# Patient Record
Sex: Male | Born: 1935 | Race: White | Hispanic: No | Marital: Married | Smoking: Never smoker
Health system: Southern US, Community
[De-identification: ages and names within clinical notes are randomized; demographics above are authoritative.]

## PROBLEM LIST (undated history)

## (undated) DIAGNOSIS — N5089 Other specified disorders of the male genital organs: Secondary | ICD-10-CM

## (undated) DIAGNOSIS — I10 Essential (primary) hypertension: Secondary | ICD-10-CM

## (undated) DIAGNOSIS — R972 Elevated prostate specific antigen [PSA]: Secondary | ICD-10-CM

## (undated) DIAGNOSIS — N471 Phimosis: Secondary | ICD-10-CM

## (undated) DIAGNOSIS — I639 Cerebral infarction, unspecified: Secondary | ICD-10-CM

## (undated) DIAGNOSIS — N3281 Overactive bladder: Secondary | ICD-10-CM

## (undated) DIAGNOSIS — E785 Hyperlipidemia, unspecified: Secondary | ICD-10-CM

## (undated) HISTORY — DX: Phimosis: N47.1

## (undated) HISTORY — DX: Hyperlipidemia, unspecified: E78.5

## (undated) HISTORY — DX: Overactive bladder: N32.81

## (undated) HISTORY — DX: Cerebral infarction, unspecified: I63.9

## (undated) HISTORY — DX: Essential (primary) hypertension: I10

## (undated) HISTORY — DX: Elevated prostate specific antigen (PSA): R97.20

## (undated) HISTORY — DX: Other specified disorders of the male genital organs: N50.89

---

## 1974-09-17 HISTORY — PX: COLONOSCOPY W/ POLYPECTOMY: SHX1380

## 2005-03-29 ENCOUNTER — Emergency Department: Payer: Self-pay | Admitting: Emergency Medicine

## 2005-05-03 ENCOUNTER — Ambulatory Visit: Payer: Self-pay | Admitting: Neurology

## 2005-05-04 ENCOUNTER — Ambulatory Visit: Payer: Self-pay | Admitting: Neurology

## 2005-06-14 ENCOUNTER — Emergency Department: Payer: Self-pay | Admitting: General Practice

## 2005-06-14 ENCOUNTER — Other Ambulatory Visit: Payer: Self-pay

## 2007-01-06 ENCOUNTER — Ambulatory Visit: Payer: Self-pay | Admitting: Neurology

## 2007-07-02 ENCOUNTER — Ambulatory Visit: Payer: Self-pay | Admitting: Unknown Physician Specialty

## 2007-09-02 ENCOUNTER — Ambulatory Visit: Payer: Self-pay | Admitting: Family Medicine

## 2007-10-21 ENCOUNTER — Other Ambulatory Visit: Payer: Self-pay

## 2007-10-21 ENCOUNTER — Ambulatory Visit: Payer: Self-pay | Admitting: Ophthalmology

## 2007-10-29 ENCOUNTER — Ambulatory Visit: Payer: Self-pay | Admitting: Ophthalmology

## 2012-04-11 ENCOUNTER — Ambulatory Visit: Payer: Self-pay

## 2012-05-22 ENCOUNTER — Emergency Department: Payer: Self-pay | Admitting: *Deleted

## 2012-05-23 LAB — COMPREHENSIVE METABOLIC PANEL
Albumin: 3.6 g/dL (ref 3.4–5.0)
Alkaline Phosphatase: 72 U/L (ref 50–136)
Anion Gap: 8 (ref 7–16)
Bilirubin,Total: 0.3 mg/dL (ref 0.2–1.0)
Calcium, Total: 8.3 mg/dL — ABNORMAL LOW (ref 8.5–10.1)
Chloride: 108 mmol/L — ABNORMAL HIGH (ref 98–107)
Creatinine: 1.2 mg/dL (ref 0.60–1.30)
EGFR (Non-African Amer.): 57 — ABNORMAL LOW
Glucose: 155 mg/dL — ABNORMAL HIGH (ref 65–99)
Osmolality: 287 (ref 275–301)
Potassium: 3.5 mmol/L (ref 3.5–5.1)
SGOT(AST): 21 U/L (ref 15–37)
Sodium: 142 mmol/L (ref 136–145)

## 2012-05-23 LAB — CBC
HCT: 37.8 % — ABNORMAL LOW (ref 40.0–52.0)
HGB: 12.7 g/dL — ABNORMAL LOW (ref 13.0–18.0)
MCH: 33.4 pg (ref 26.0–34.0)
MCHC: 33.5 g/dL (ref 32.0–36.0)
RDW: 15.5 % — ABNORMAL HIGH (ref 11.5–14.5)
WBC: 6.6 10*3/uL (ref 3.8–10.6)

## 2012-05-23 LAB — PROTIME-INR: Prothrombin Time: 12.8 secs (ref 11.5–14.7)

## 2012-05-23 LAB — APTT: Activated PTT: 30.5 secs (ref 23.6–35.9)

## 2012-07-07 DIAGNOSIS — R972 Elevated prostate specific antigen [PSA]: Secondary | ICD-10-CM | POA: Insufficient documentation

## 2012-07-07 DIAGNOSIS — R351 Nocturia: Secondary | ICD-10-CM | POA: Insufficient documentation

## 2012-07-07 DIAGNOSIS — R350 Frequency of micturition: Secondary | ICD-10-CM | POA: Insufficient documentation

## 2012-07-07 DIAGNOSIS — N3941 Urge incontinence: Secondary | ICD-10-CM | POA: Insufficient documentation

## 2012-07-07 DIAGNOSIS — N401 Enlarged prostate with lower urinary tract symptoms: Secondary | ICD-10-CM | POA: Insufficient documentation

## 2013-12-23 DIAGNOSIS — I10 Essential (primary) hypertension: Secondary | ICD-10-CM

## 2013-12-23 DIAGNOSIS — Z8669 Personal history of other diseases of the nervous system and sense organs: Secondary | ICD-10-CM | POA: Insufficient documentation

## 2013-12-23 HISTORY — DX: Essential (primary) hypertension: I10

## 2014-04-24 ENCOUNTER — Ambulatory Visit: Payer: Self-pay | Admitting: Neurology

## 2014-07-14 ENCOUNTER — Ambulatory Visit: Payer: Self-pay | Admitting: Urology

## 2014-11-03 ENCOUNTER — Ambulatory Visit: Payer: Self-pay | Admitting: Urology

## 2014-11-03 DIAGNOSIS — I10 Essential (primary) hypertension: Secondary | ICD-10-CM

## 2014-11-15 ENCOUNTER — Ambulatory Visit: Payer: Self-pay | Admitting: Urology

## 2014-11-23 ENCOUNTER — Emergency Department: Payer: Self-pay | Admitting: Emergency Medicine

## 2015-01-10 LAB — SURGICAL PATHOLOGY

## 2015-01-16 NOTE — Op Note (Signed)
PATIENT NAME:  Jacob Clark, Jacob D MR#:  [id number] DATE OF BIRTH:  06-10-1930  DATE OF PROCEDURE:  11/15/2014  PREOPERATIVE DIAGNOSIS: Severe phimosis with adherence to the glans penis by the foreskin.   POSTOPERATIVE DIAGNOSIS: Severe phimosis with adherence to the glans penis by the foreskin.  PROCEDURE: Adult circumcision.   ANESTHESIA: General and local ring block at the base of the penis.   SURGEON: Lorraine Laxichard D. Kaylynne Andres.   DESCRIPTION OF PROCEDURE: With the patient sterilely prepped and draped in supine position, on the operating room table, the procedure commences after an appropriate timeout. The glans so adhered to the foreskin that a lot of sharp dissection has to be done. Eventually I am able to find the corona and at least some mucosa in place for the reapproximation of the penile skin to the area of the mucosa of the corona radiata. Once I free the whole glans up carefully avoiding any excursion into the urethra, which is almost adherent to the skin, I am able to circumferentially remove the excess foreskin and incise the lateral fascial bands. Then I am able to reapproximate the penile skin to the mucosa, at the corona radiata, with interrupted 4-0 chromic suture. Bleeding was controlled with electrocautery. Then a circumferential 1 inch wrap is placed over a Xeroform dressing around the reapproximation site. The penis is then wrapped with this 1 inch Kling. The patient is sent to recovery in satisfactory condition with a fairly good result considering the severe prepubertal adhesions that were present.   ____________________________ Caralyn Guileichard D. Edwyna ShellHart, DO rdh:sb D: 11/15/2014 09:52:16 ET T: 11/15/2014 10:15:52 ET JOB#: [id number]  cc: Caralyn Guileichard D. Edwyna ShellHart, DO, <Dictator> Arcenio Mullaly D Alejandra Hunt DO ELECTRONICALLY SIGNED 11/29/2014 7:36

## 2015-03-15 ENCOUNTER — Ambulatory Visit: Payer: Medicare Other | Admitting: Podiatry

## 2015-03-24 ENCOUNTER — Ambulatory Visit: Payer: Medicare Other | Admitting: Podiatry

## 2015-03-31 ENCOUNTER — Encounter: Payer: Self-pay | Admitting: Podiatry

## 2015-03-31 ENCOUNTER — Ambulatory Visit (INDEPENDENT_AMBULATORY_CARE_PROVIDER_SITE_OTHER): Payer: Medicare Other | Admitting: Podiatry

## 2015-03-31 VITALS — BP 132/73 | HR 60 | Resp 18 | Ht 75.0 in | Wt 230.0 lb

## 2015-03-31 DIAGNOSIS — B351 Tinea unguium: Secondary | ICD-10-CM

## 2015-03-31 DIAGNOSIS — M79676 Pain in unspecified toe(s): Secondary | ICD-10-CM

## 2015-03-31 NOTE — Progress Notes (Signed)
   Subjective:    Patient ID: Jacob Clark, male    DOB: 03/30/1930, 81 y.o.   MRN: [id number]  HPI 81 year old male presents the office they with his wife for painful, elongated toenails for which she is unable to trim himself. Denies any redness or drainage from the nail sites. States his nails are painful particularly with shoe gear as the cause rubbing and irritation. No other complaints at this time.   Review of Systems  HENT:       Hearing loss  Genitourinary: Positive for urgency and frequency.  Musculoskeletal: Positive for gait problem.  Neurological: Positive for tremors and weakness.  Psychiatric/Behavioral: Positive for confusion.  All other systems reviewed and are negative.      Objective:   Physical Exam Awake, alert, NAD  DP/PT pulses are palpable 1/4, CRT less than 3 seconds Protective sensation slightly decreased with Simms Weinstein monofilament, vibratory sensation appears to be decreased, Achilles tendon reflex intact. Nails are hypertrophic, dystrophic, brittle, elongated 10. There is no surrounding erythema or drainage in the nail sites. There is tenderness to palpation overlying nails 1-5 bilaterally. No open lesions or pre-ulcerative lesions identified bilaterally. No other areas of tenderness to bilateral lower extremities.  No pain with calf compression, swelling, warmth, erythema.     Assessment & Plan:   81 year old male with symptomatic onychomycosis -Treatment options discussed including all alternatives, risks, and complications -Nails sharply debrided 10 without complication/bleeding. -Discussed the importance of daily foot inspection. -Follow-up 3 months or sooner if any problems arise. In the meantime, encouraged to call the office with any questions, concerns, change in symptoms.   Ovid CurdMatthew Cannen Dupras, DPM

## 2015-04-03 ENCOUNTER — Encounter: Payer: Self-pay | Admitting: Podiatry

## 2015-04-06 ENCOUNTER — Encounter: Payer: Self-pay | Admitting: Urology

## 2015-04-06 ENCOUNTER — Ambulatory Visit (INDEPENDENT_AMBULATORY_CARE_PROVIDER_SITE_OTHER): Payer: Medicare Other | Admitting: Urology

## 2015-04-06 VITALS — BP 105/65 | HR 68 | Ht 73.0 in | Wt 227.0 lb

## 2015-04-06 DIAGNOSIS — N3281 Overactive bladder: Secondary | ICD-10-CM | POA: Diagnosis not present

## 2015-04-06 LAB — URINALYSIS, COMPLETE
Bilirubin, UA: NEGATIVE
GLUCOSE, UA: NEGATIVE
Leukocytes, UA: NEGATIVE
Nitrite, UA: NEGATIVE
RBC UA: NEGATIVE
Specific Gravity, UA: 1.02 (ref 1.005–1.030)
Urobilinogen, Ur: 0.2 mg/dL (ref 0.2–1.0)
pH, UA: 6 (ref 5.0–7.5)

## 2015-04-06 LAB — MICROSCOPIC EXAMINATION: Bacteria, UA: NONE SEEN

## 2015-04-06 LAB — BLADDER SCAN AMB NON-IMAGING

## 2015-04-06 NOTE — Progress Notes (Signed)
04/06/2015 2:58 PM   Jacob Clark 05-02-1930 [id number]  Referring provider: Jerl MinaJames Hedrick, MD [address] [place], [place] [address]  Chief Complaint  Patient presents with  . Over Active Bladder    HPI: Vesicare did not work for this patient. He's been recently placed on Exelon patch for his advancing Alzheimer's. I will try mybetriq on this patient. If if this does not workI will consider using Botox for intravesical therapy   PMH: Past Medical History  Diagnosis Date  . Hypertension   . Stroke   . Hyperlipemia   . OAB (overactive bladder)   . Scrotal mass   . Elevated PSA   . Phimosis     Surgical History: Past Surgical History  Procedure Laterality Date  . Colonoscopy w/ polypectomy  1976    Home Medications:    Medication List       This list is accurate as of: 04/06/15  2:58 PM.  Always use your most recent med list.               acyclovir 800 MG tablet  Commonly known as:  ZOVIRAX     amLODipine-benazepril 10-20 MG per capsule  Commonly known as:  LOTREL     aspirin EC 81 MG tablet  Take by mouth.     citalopram 40 MG tablet  Commonly known as:  CELEXA     donepezil 10 MG tablet  Commonly known as:  ARICEPT     MULTI-VITAMINS Tabs  Take by mouth.     Olive Leaf Extract 250 MG Caps  Take by mouth.     pentoxifylline 400 MG CR tablet  Commonly known as:  TRENTAL     PREVAGEN PO  Take by mouth.     propranolol ER 120 MG 24 hr capsule  Commonly known as:  INDERAL LA     rivastigmine 4.6 mg/24hr  Commonly known as:  EXELON     SM GINKGO BILOBA 60 MG Tabs  Generic drug:  Ginkgo Biloba  Take by mouth.     vitamin B-1 250 MG tablet  Take by mouth.     VITAMIN D-1000 MAX ST 1000 UNITS tablet  Generic drug:  Cholecalciferol  Take by mouth.        Allergies:  Allergies  Allergen Reactions  . Alprazolam Nausea And Vomiting    Other reaction(s): Unknown Other reaction(s): UNSPECIFIED  . Memantine Hcl Nausea And  Vomiting  . Memantine Rash    Other reaction(s): SWELLING/EDEMA    Family History: Family History  Problem Relation Age of Onset  . Bladder Cancer Neg Hx   . Kidney cancer Neg Hx   . Prostate cancer Neg Hx     Social History:  reports that he has never smoked. He has never used smokeless tobacco. He reports that he does not drink alcohol or use illicit drugs.  ROS: UROLOGY Frequent Urination?: Yes Hard to postpone urination?: Yes Burning/pain with urination?: No Get up at night to urinate?: Yes Leakage of urine?: No Urine stream starts and stops?: No Trouble starting stream?: No Do you have to strain to urinate?: No Blood in urine?: No Urinary tract infection?: No Sexually transmitted disease?: No Injury to kidneys or bladder?: No Painful intercourse?: No Weak stream?: Yes Erection problems?: No Penile pain?: No  Gastrointestinal Nausea?: No Vomiting?: No Indigestion/heartburn?: No Diarrhea?: No Constipation?: No  Constitutional Fever: No Night sweats?: No Weight loss?: No Fatigue?: No  Skin Skin rash/lesions?: No Itching?: No  Eyes Blurred vision?: No Double vision?: No  Ears/Nose/Throat Sore throat?: No Sinus problems?: No  Hematologic/Lymphatic Swollen glands?: No Easy bruising?: No  Cardiovascular Leg swelling?: No Chest pain?: No  Respiratory Cough?: No Shortness of breath?: No  Endocrine Excessive thirst?: No  Musculoskeletal Back pain?: No Joint pain?: No  Neurological Headaches?: No Dizziness?: Yes  Psychologic Depression?: No Anxiety?: No  Physical Exam: BP 105/65 mmHg  Pulse 68  Ht  (1.854 m)  Wt 227 lb (102.967 kg)  BMI 29.96 kg/m2  Constitutional:  Alert and oriented, No acute distress. HEENT: [place] AT, moist mucus membranes.  Trachea midline, no masses. Cardiovascular: No clubbing, cyanosis, or edema. Respiratory: Normal respiratory effort, no increased work of breathing. GI: Abdomen is soft, nontender,  nondistended, no abdominal masses  GU: No CVA tenderness.  Skin: No rashes, bruises or suspicious lesions. Lymph: No cervical or inguinal adenopathy. Neurologic: Grossly intact, no focal deficits, moving all 4 extremities. Psychiatric: Normal mood and affect.  Laboratory Data: Lab Results  Component Value Date   WBC 6.6 05/23/2012   HGB 12.7* 05/23/2012   HCT 37.8* 05/23/2012   MCV 100 05/23/2012   PLT 197 05/23/2012    Lab Results  Component Value Date   CREATININE 1.20 05/23/2012    No results found for: PSA  No results found for: TESTOSTERONE  No results found for: HGBA1C  Urinalysis No results found for: COLORURINE, APPEARANCEUR, LABSPEC, PHURINE, GLUCOSEU, HGBUR, BILIRUBINUR, KETONESUR, PROTEINUR, UROBILINOGEN, NITRITE, LEUKOCYTESUR  Pertinent Imaging: none  Assessment & Plan:  Place the patient on Mreevaluated in 6 weeksM YBETRIQ   1. OAB (overactive bladder)  - Urinalysis, Complete - BLADDER SCAN AMB NON-IMAGING   No Follow-up on file.  Lorraine Lax, MD  [facility] [address] [place], [place] [address] [phone]

## 2015-05-11 ENCOUNTER — Ambulatory Visit: Payer: Medicare Other | Admitting: Urology

## 2015-05-31 ENCOUNTER — Encounter: Payer: Self-pay | Admitting: Urology

## 2015-05-31 ENCOUNTER — Ambulatory Visit (INDEPENDENT_AMBULATORY_CARE_PROVIDER_SITE_OTHER): Payer: Medicare Other | Admitting: Urology

## 2015-05-31 VITALS — BP 102/67 | HR 63 | Ht 74.0 in | Wt 226.8 lb

## 2015-05-31 DIAGNOSIS — N5089 Other specified disorders of the male genital organs: Secondary | ICD-10-CM | POA: Insufficient documentation

## 2015-05-31 DIAGNOSIS — N390 Urinary tract infection, site not specified: Secondary | ICD-10-CM | POA: Diagnosis not present

## 2015-05-31 DIAGNOSIS — N3281 Overactive bladder: Secondary | ICD-10-CM

## 2015-05-31 DIAGNOSIS — T7840XA Allergy, unspecified, initial encounter: Secondary | ICD-10-CM | POA: Insufficient documentation

## 2015-05-31 DIAGNOSIS — I639 Cerebral infarction, unspecified: Secondary | ICD-10-CM

## 2015-05-31 DIAGNOSIS — N471 Phimosis: Secondary | ICD-10-CM | POA: Insufficient documentation

## 2015-05-31 DIAGNOSIS — I10 Essential (primary) hypertension: Secondary | ICD-10-CM

## 2015-05-31 DIAGNOSIS — E785 Hyperlipidemia, unspecified: Secondary | ICD-10-CM | POA: Insufficient documentation

## 2015-05-31 DIAGNOSIS — Z8673 Personal history of transient ischemic attack (TIA), and cerebral infarction without residual deficits: Secondary | ICD-10-CM | POA: Insufficient documentation

## 2015-05-31 HISTORY — DX: Essential (primary) hypertension: I10

## 2015-05-31 HISTORY — DX: Cerebral infarction, unspecified: I63.9

## 2015-05-31 LAB — URINALYSIS, COMPLETE
BILIRUBIN UA: POSITIVE — AB
Glucose, UA: NEGATIVE
LEUKOCYTES UA: NEGATIVE
Nitrite, UA: NEGATIVE
PH UA: 6 (ref 5.0–7.5)
Specific Gravity, UA: 1.02 (ref 1.005–1.030)
UUROB: 0.2 mg/dL (ref 0.2–1.0)

## 2015-05-31 LAB — MICROSCOPIC EXAMINATION
RBC, UA: >30 /hpf — ABNORMAL HIGH (ref 0–?)
WBC, UA: NONE SEEN /hpf (ref 0–?)

## 2015-05-31 LAB — BLADDER SCAN AMB NON-IMAGING: Scan Result: 32

## 2015-05-31 MED ORDER — SULFAMETHOXAZOLE-TRIMETHOPRIM 800-160 MG PO TABS
1.0000 | ORAL_TABLET | Freq: Once | ORAL | Status: DC
Start: 2015-05-31 — End: 2015-12-01

## 2015-05-31 NOTE — Progress Notes (Signed)
Bladder Scan Patient void: 32 ml Performed By: K.Russell,CMA 

## 2015-05-31 NOTE — Progress Notes (Signed)
05/31/2015 2:52 PM   Jacob Clark 20-Apr-1930 [id number]  Referring provider: Jerl Mina, MD [address] [place], [place] [address]  Chief Complaint  Patient presents with  . Over Active Bladder    HPI: This patient has had a long history of overactive bladder. Is beginning to lose his memory but has responded well to beta 3 blocker. We will to maintain samples of this. His bacteria in his urine today is ongoing put him on Bactrim daily for a month. See him again in 2 months   PMH: Past Medical History  Diagnosis Date  . Hypertension   . Stroke   . Hyperlipemia   . OAB (overactive bladder)   . Scrotal mass   . Elevated PSA   . Phimosis   . BP (high blood pressure) 05/31/2015  . Cerebral vascular accident 05/31/2015  . Essential (primary) hypertension 12/23/2013    Surgical History: Past Surgical History  Procedure Laterality Date  . Colonoscopy w/ polypectomy  1976    Home Medications:    Medication List       This list is accurate as of: 05/31/15  2:52 PM.  Always use your most recent med list.               acyclovir 800 MG tablet  Commonly known as:  ZOVIRAX     ALEVE 220 MG Caps  Generic drug:  Naproxen Sodium  Take by mouth.     amLODipine-benazepril 10-20 MG per capsule  Commonly known as:  LOTREL     aspirin EC 81 MG tablet  Take by mouth.     citalopram 40 MG tablet  Commonly known as:  CELEXA     donepezil 10 MG tablet  Commonly known as:  ARICEPT     MULTI-VITAMINS Tabs  Take by mouth.     Olive Leaf Extract 250 MG Caps  Take by mouth.     pentoxifylline 400 MG CR tablet  Commonly known as:  TRENTAL     PREVAGEN PO  Take by mouth.     propranolol ER 120 MG 24 hr capsule  Commonly known as:  INDERAL LA     rivastigmine 4.6 mg/24hr  Commonly known as:  EXELON     SM GINKGO BILOBA 60 MG Tabs  Generic drug:  Ginkgo Biloba  Take by mouth.     vitamin B-1 250 MG tablet  Take by mouth.     VITAMIN D-1000 MAX ST 1000  UNITS tablet  Generic drug:  Cholecalciferol  Take by mouth.        Allergies:  Allergies  Allergen Reactions  . Alprazolam Nausea And Vomiting    Other reaction(s): Unknown Other reaction(s): UNSPECIFIED  . Memantine Hcl Nausea And Vomiting and Swelling  . Memantine Rash    Other reaction(s): SWELLING/EDEMA    Family History: Family History  Problem Relation Age of Onset  . Bladder Cancer Neg Hx   . Kidney cancer Neg Hx   . Prostate cancer Neg Hx     Social History:  reports that he has never smoked. He has never used smokeless tobacco. He reports that he does not drink alcohol or use illicit drugs.  ROS: UROLOGY Frequent Urination?: Yes Hard to postpone urination?: No Burning/pain with urination?: No Get up at night to urinate?: Yes Leakage of urine?: No Urine stream starts and stops?: No Trouble starting stream?: No Do you have to strain to urinate?: No Blood in urine?: No Urinary tract infection?:  No Sexually transmitted disease?: No Injury to kidneys or bladder?: No Painful intercourse?: No Weak stream?: No Erection problems?: No Penile pain?: No  Gastrointestinal Nausea?: No Vomiting?: No Indigestion/heartburn?: No Diarrhea?: No Constipation?: No  Constitutional Fever: No Night sweats?: No Weight loss?: No Fatigue?: No  Skin Skin rash/lesions?: No Itching?: No  Eyes Blurred vision?: No Double vision?: No  Ears/Nose/Throat Sore throat?: No Sinus problems?: No  Hematologic/Lymphatic Swollen glands?: No Easy bruising?: No  Cardiovascular Leg swelling?: No Chest pain?: No  Respiratory Cough?: No Shortness of breath?: No  Endocrine Excessive thirst?: No  Musculoskeletal Back pain?: No Joint pain?: No  Neurological Headaches?: No Dizziness?: No  Psychologic Depression?: No Anxiety?: No  Physical Exam: BP 102/67 mmHg  Pulse 63  Ht  (1.88 m)  Wt 226 lb 12.8 oz (102.876 kg)  BMI 29.11 kg/m2  Constitutional:   Alert and oriented, No acute distress. HEENT: [place] AT, moist mucus membranes.  Trachea midline, no masses. Cardiovascular: No clubbing, cyanosis, or edema. Respiratory: Normal respiratory effort, no increased work of breathing. GI: Abdomen is soft, nontender, nondistended, no abdominal masses GU: No CVA tenderness. Testes descended and normal penis circumcised Skin: No rashes, bruises or suspicious lesions. Lymph: No cervical or inguinal adenopathy. Neurologic: Grossly intact, no focal deficits, moving all 4 extremities. Psychiatric: Normal mood and affect.  Laboratory Data: Lab Results  Component Value Date   WBC 6.6 05/23/2012   HGB 12.7* 05/23/2012   HCT 37.8* 05/23/2012   MCV 100 05/23/2012   PLT 197 05/23/2012    Lab Results  Component Value Date   CREATININE 1.20 05/23/2012    No results found for: PSA  No results found for: TESTOSTERONE  No results found for: HGBA1C  Urinalysis    Component Value Date/Time   GLUCOSEU Negative 04/06/2015 1358   BILIRUBINUR Negative 04/06/2015 1358   NITRITE Negative 04/06/2015 1358   LEUKOCYTESUR Negative 04/06/2015 1358    Pertinent Imaging: None  Assessment & Plan: UTI and overactive bladder some improvement with nocturia from 4-2x on beta 3 blocker will give him samples of same place him on antibiotic as he has a bacteria in the urine.  1. OAB (overactive bladder) Improved on mmbetriq but patient cannot afford  mybetriq so had to give him sam;les  follow-up in 2 m - Urinalysis, Complete - Bladder Scan (Post Void Residual) in office   No Follow-up on file.  Lorraine Lax, MD  [facility] [address], [address] [place], [place] [address] [phone]

## 2015-07-05 ENCOUNTER — Ambulatory Visit: Payer: Medicare Other | Admitting: Podiatry

## 2015-07-07 ENCOUNTER — Ambulatory Visit (INDEPENDENT_AMBULATORY_CARE_PROVIDER_SITE_OTHER): Payer: Medicare Other | Admitting: Podiatry

## 2015-07-07 ENCOUNTER — Encounter: Payer: Self-pay | Admitting: Podiatry

## 2015-07-07 DIAGNOSIS — B351 Tinea unguium: Secondary | ICD-10-CM | POA: Diagnosis not present

## 2015-07-07 DIAGNOSIS — M79676 Pain in unspecified toe(s): Secondary | ICD-10-CM

## 2015-07-08 NOTE — Progress Notes (Signed)
Patient ID: Lowella GripFrederick D Campoverde, male   DOB: November 14, 1929, 81 y.o.   MRN: [id number]  Subjective: 81 y.o. returns the office today for painful, elongated, thickened toenails which he is unable to trim himself. Denies any redness or drainage around the nails. Denies any acute changes since last appointment and no new complaints today. Denies any systemic complaints such as fevers, chills, nausea, vomiting.   Objective: AAO 3, NAD DP/PT pulses palpable 1/4, CRT less than 3 seconds Protective sensation decreased with Simms Weinstein monofilament Nails hypertrophic, dystrophic, elongated, brittle, discolored 10. There is tenderness overlying the nails 1-5 bilaterally. There is no surrounding erythema or drainage along the nail sites. No open lesions or pre-ulcerative lesions are identified. No other areas of tenderness bilateral lower extremities. No overlying edema, erythema, increased warmth. No pain with calf compression, swelling, warmth, erythema.  Assessment: Patient presents with symptomatic onychomycosis  Plan: -Treatment options including alternatives, risks, complications were discussed -Nails sharply debrided 10 without complication/bleeding. -Discussed daily foot inspection. If there are any changes, to call the office immediately.  -Follow-up in 3 months or sooner if any problems are to arise. In the meantime, encouraged to call the office with any questions, concerns, changes symptoms.  Ovid CurdMatthew Wagoner, DPM

## 2015-08-02 ENCOUNTER — Ambulatory Visit: Payer: Medicare Other

## 2015-08-03 ENCOUNTER — Ambulatory Visit: Payer: Medicare Other

## 2015-08-04 ENCOUNTER — Ambulatory Visit (INDEPENDENT_AMBULATORY_CARE_PROVIDER_SITE_OTHER): Payer: Medicare Other | Admitting: Urology

## 2015-08-04 ENCOUNTER — Encounter: Payer: Self-pay | Admitting: Urology

## 2015-08-04 VITALS — BP 117/76 | HR 63 | Ht 73.0 in | Wt 227.4 lb

## 2015-08-04 DIAGNOSIS — R350 Frequency of micturition: Secondary | ICD-10-CM

## 2015-08-04 DIAGNOSIS — I639 Cerebral infarction, unspecified: Secondary | ICD-10-CM | POA: Insufficient documentation

## 2015-08-04 DIAGNOSIS — N3281 Overactive bladder: Secondary | ICD-10-CM | POA: Diagnosis not present

## 2015-08-04 DIAGNOSIS — R3129 Other microscopic hematuria: Secondary | ICD-10-CM | POA: Diagnosis not present

## 2015-08-04 LAB — URINALYSIS, COMPLETE
Bilirubin, UA: NEGATIVE
Glucose, UA: NEGATIVE
Ketones, UA: NEGATIVE
Leukocytes, UA: NEGATIVE
NITRITE UA: NEGATIVE
PH UA: 7 (ref 5.0–7.5)
Specific Gravity, UA: 1.02 (ref 1.005–1.030)
UUROB: 0.2 mg/dL (ref 0.2–1.0)

## 2015-08-04 LAB — MICROSCOPIC EXAMINATION
EPITHELIAL CELLS (NON RENAL): NONE SEEN /HPF (ref 0–10)
WBC UA: NONE SEEN /HPF (ref 0–?)

## 2015-08-04 NOTE — Progress Notes (Signed)
08/04/2015 3:10 PM   Lowella GripFrederick D Anding 04/05/1930 [id number]  Referring provider: Jerl MinaJames Hedrick, MD [address] [facility] [place] [place] [place], [place] [address]  Chief Complaint  Patient presents with  . Over Active Bladder    2 mth f/u     HPI: The patient is a 81 year old gentleman who returns for follow-up for his over active bladder symptoms. Of note, the patient has dementia and is unable to provide any history. His wife provides the history. He was given samples in the Myrbetriq at his last appointment. His wife notes that there was slight improvement in his urinary symptoms on the Myrbetriq, but the medication was too expensive for her to force that she stopped it. She does not think it helped him enough to warrant buying a medication. He had a low PVRs last visit. He is also treated for urinary tract infection.    PMH: Past Medical History  Diagnosis Date  . Hypertension   . Stroke (HCC)   . Hyperlipemia   . OAB (overactive bladder)   . Scrotal mass   . Elevated PSA   . Phimosis   . BP (high blood pressure) 05/31/2015  . Cerebral vascular accident (HCC) 05/31/2015  . Essential (primary) hypertension 12/23/2013    Surgical History: Past Surgical History  Procedure Laterality Date  . Colonoscopy w/ polypectomy  1976    Home Medications:    Medication List       This list is accurate as of: 08/04/15  3:10 PM.  Always use your most recent med list.               acyclovir 800 MG tablet  Commonly known as:  ZOVIRAX     AGGRENOX 200-25 MG 12hr capsule  Generic drug:  dipyridamole-aspirin     ALEVE 220 MG Caps  Generic drug:  Naproxen Sodium  Take by mouth.     amLODipine-benazepril 10-20 MG capsule  Commonly known as:  LOTREL     aspirin EC 81 MG tablet  Take by mouth.     citalopram 40 MG tablet  Commonly known as:  CELEXA     MULTI-VITAMINS Tabs  Take by mouth.     Olive Leaf Extract 250 MG Caps  Take by mouth.     pentoxifylline 400 MG CR  tablet  Commonly known as:  TRENTAL     PREVAGEN PO  Take by mouth.     propranolol ER 120 MG 24 hr capsule  Commonly known as:  INDERAL LA     rivastigmine 9.5 mg/24hr  Commonly known as:  EXELON     SM GINKGO BILOBA 60 MG Tabs  Generic drug:  Ginkgo Biloba  Take by mouth.     sulfamethoxazole-trimethoprim 800-160 MG tablet  Commonly known as:  BACTRIM DS,SEPTRA DS  Take 1 tablet by mouth once.     vitamin B-1 250 MG tablet  Take by mouth.     VITAMIN D-1000 MAX ST 1000 UNITS tablet  Generic drug:  Cholecalciferol  Take by mouth.        Allergies:  Allergies  Allergen Reactions  . Alprazolam Nausea And Vomiting    Other reaction(s): Unknown Other reaction(s): UNSPECIFIED  . Memantine Hcl Nausea And Vomiting and Swelling  . Memantine Rash    Other reaction(s): SWELLING/EDEMA    Family History: Family History  Problem Relation Age of Onset  . Bladder Cancer Neg Hx   . Kidney cancer Neg Hx   . Prostate cancer  Neg Hx     Social History:  reports that he has never smoked. He has never used smokeless tobacco. He reports that he does not drink alcohol or use illicit drugs.  ROS: UROLOGY Frequent Urination?: Yes Hard to postpone urination?: Yes Burning/pain with urination?: No Get up at night to urinate?: Yes Leakage of urine?: Yes Urine stream starts and stops?: No Trouble starting stream?: No Do you have to strain to urinate?: No Blood in urine?: No Urinary tract infection?: No Sexually transmitted disease?: No Injury to kidneys or bladder?: No Painful intercourse?: No Weak stream?: No Erection problems?: No Penile pain?: No  Gastrointestinal Nausea?: No Vomiting?: No Indigestion/heartburn?: No Diarrhea?: No Constipation?: No  Constitutional Fever: No Night sweats?: No Weight loss?: No Fatigue?: No  Skin Skin rash/lesions?: No Itching?: No  Eyes Blurred vision?: No Double vision?: No  Ears/Nose/Throat Sore throat?: No Sinus  problems?: No  Hematologic/Lymphatic Swollen glands?: No Easy bruising?: No  Cardiovascular Leg swelling?: No Chest pain?: No  Respiratory Cough?: No Shortness of breath?: No  Endocrine Excessive thirst?: No  Musculoskeletal Back pain?: No Joint pain?: No  Neurological Headaches?: No Dizziness?: No  Psychologic Depression?: No Anxiety?: No  Physical Exam: BP 117/76 mmHg  Pulse 63  Ht  (1.854 m)  Wt 227 lb 6.4 oz (103.148 kg)  BMI 30.01 kg/m2  Constitutional:  Alert and oriented, No acute distress. HEENT: [place] AT, moist mucus membranes.  Trachea midline, no masses. Cardiovascular: No clubbing, cyanosis, or edema. Respiratory: Normal respiratory effort, no increased work of breathing. GI: Abdomen is soft, nontender, nondistended, no abdominal masses GU: No CVA tenderness.  Skin: No rashes, bruises or suspicious lesions. Lymph: No cervical or inguinal adenopathy. Neurologic: Grossly intact, no focal deficits, moving all 4 extremities. Psychiatric: Normal mood and affect.  Laboratory Data: Lab Results  Component Value Date   WBC 6.6 05/23/2012   HGB 12.7* 05/23/2012   HCT 37.8* 05/23/2012   MCV 100 05/23/2012   PLT 197 05/23/2012    Lab Results  Component Value Date   CREATININE 1.20 05/23/2012    No results found for: PSA  No results found for: TESTOSTERONE  No results found for: HGBA1C  Urinalysis    Component Value Date/Time   GLUCOSEU Negative 05/31/2015 1405   BILIRUBINUR Positive* 05/31/2015 1405   NITRITE Negative 05/31/2015 1405   LEUKOCYTESUR Negative 05/31/2015 1405     Assessment & Plan:    1. OAB The patient's wife did not feel that the improvement in his symptoms were worth the cost of Myrbetriq. The patient and his wife are not interested in further treatment of his overactive bladder as a do not find that bothersome at this time.   2. Microscopic hematuria The patient had microscopic hematuria on his urinalysis today. I  discussed the hematuria workup with his wife which includes a CT urogram and cystoscopy. Given his age and his significant dementia, his wife is not interested in pursuing this workup at this time. She is also concerned about the cost of what this workup would be. Next  3. Asymptomatic bacteriuria No treatment at this time is the patient is not symptomatic.  Since the patient and his wife do not desire further treatment his overactive bladder and do not want to pursue his microscopic hematuria, the patient can follow-up with me as needed if symptoms change.   Return if symptoms worsen or fail to improve.  Hildred Laser, MD  [facility] [address]  [place], [place] [phone]

## 2015-10-13 ENCOUNTER — Ambulatory Visit: Payer: Medicare Other | Admitting: Podiatry

## 2015-12-01 ENCOUNTER — Emergency Department: Payer: Medicare Other

## 2015-12-01 ENCOUNTER — Inpatient Hospital Stay
Admission: EM | Admit: 2015-12-01 | Discharge: 2015-12-17 | DRG: 871 | Disposition: E | Payer: Medicare Other | Attending: Internal Medicine | Admitting: Internal Medicine

## 2015-12-01 ENCOUNTER — Encounter: Payer: Self-pay | Admitting: *Deleted

## 2015-12-01 DIAGNOSIS — Z9889 Other specified postprocedural states: Secondary | ICD-10-CM | POA: Diagnosis not present

## 2015-12-01 DIAGNOSIS — R402432 Glasgow coma scale score 3-8, at arrival to emergency department: Secondary | ICD-10-CM | POA: Diagnosis present

## 2015-12-01 DIAGNOSIS — Z888 Allergy status to other drugs, medicaments and biological substances status: Secondary | ICD-10-CM | POA: Diagnosis not present

## 2015-12-01 DIAGNOSIS — G9341 Metabolic encephalopathy: Secondary | ICD-10-CM | POA: Diagnosis present

## 2015-12-01 DIAGNOSIS — E860 Dehydration: Secondary | ICD-10-CM | POA: Diagnosis present

## 2015-12-01 DIAGNOSIS — I959 Hypotension, unspecified: Secondary | ICD-10-CM | POA: Diagnosis present

## 2015-12-01 DIAGNOSIS — Z66 Do not resuscitate: Secondary | ICD-10-CM | POA: Diagnosis present

## 2015-12-01 DIAGNOSIS — E861 Hypovolemia: Secondary | ICD-10-CM | POA: Diagnosis present

## 2015-12-01 DIAGNOSIS — F0390 Unspecified dementia without behavioral disturbance: Secondary | ICD-10-CM | POA: Diagnosis present

## 2015-12-01 DIAGNOSIS — D696 Thrombocytopenia, unspecified: Secondary | ICD-10-CM | POA: Diagnosis present

## 2015-12-01 DIAGNOSIS — E870 Hyperosmolality and hypernatremia: Secondary | ICD-10-CM | POA: Diagnosis present

## 2015-12-01 DIAGNOSIS — I10 Essential (primary) hypertension: Secondary | ICD-10-CM | POA: Diagnosis present

## 2015-12-01 DIAGNOSIS — E559 Vitamin D deficiency, unspecified: Secondary | ICD-10-CM | POA: Diagnosis present

## 2015-12-01 DIAGNOSIS — R6521 Severe sepsis with septic shock: Secondary | ICD-10-CM | POA: Diagnosis not present

## 2015-12-01 DIAGNOSIS — R092 Respiratory arrest: Secondary | ICD-10-CM | POA: Diagnosis not present

## 2015-12-01 DIAGNOSIS — J9690 Respiratory failure, unspecified, unspecified whether with hypoxia or hypercapnia: Secondary | ICD-10-CM

## 2015-12-01 DIAGNOSIS — E46 Unspecified protein-calorie malnutrition: Secondary | ICD-10-CM | POA: Diagnosis present

## 2015-12-01 DIAGNOSIS — N179 Acute kidney failure, unspecified: Secondary | ICD-10-CM | POA: Diagnosis not present

## 2015-12-01 DIAGNOSIS — A419 Sepsis, unspecified organism: Secondary | ICD-10-CM | POA: Diagnosis present

## 2015-12-01 DIAGNOSIS — R4020 Unspecified coma: Secondary | ICD-10-CM

## 2015-12-01 DIAGNOSIS — Z79899 Other long term (current) drug therapy: Secondary | ICD-10-CM | POA: Diagnosis not present

## 2015-12-01 DIAGNOSIS — N170 Acute kidney failure with tubular necrosis: Secondary | ICD-10-CM | POA: Diagnosis present

## 2015-12-01 DIAGNOSIS — N3281 Overactive bladder: Secondary | ICD-10-CM | POA: Diagnosis present

## 2015-12-01 DIAGNOSIS — J189 Pneumonia, unspecified organism: Secondary | ICD-10-CM | POA: Diagnosis present

## 2015-12-01 DIAGNOSIS — Z8673 Personal history of transient ischemic attack (TIA), and cerebral infarction without residual deficits: Secondary | ICD-10-CM | POA: Diagnosis not present

## 2015-12-01 DIAGNOSIS — I638 Other cerebral infarction: Secondary | ICD-10-CM | POA: Diagnosis not present

## 2015-12-01 DIAGNOSIS — I639 Cerebral infarction, unspecified: Secondary | ICD-10-CM | POA: Diagnosis present

## 2015-12-01 DIAGNOSIS — Z515 Encounter for palliative care: Secondary | ICD-10-CM | POA: Diagnosis present

## 2015-12-01 DIAGNOSIS — J9601 Acute respiratory failure with hypoxia: Secondary | ICD-10-CM | POA: Diagnosis present

## 2015-12-01 DIAGNOSIS — F0280 Dementia in other diseases classified elsewhere without behavioral disturbance: Secondary | ICD-10-CM | POA: Diagnosis present

## 2015-12-01 DIAGNOSIS — G309 Alzheimer's disease, unspecified: Secondary | ICD-10-CM | POA: Diagnosis present

## 2015-12-01 DIAGNOSIS — I248 Other forms of acute ischemic heart disease: Secondary | ICD-10-CM | POA: Diagnosis present

## 2015-12-01 DIAGNOSIS — E875 Hyperkalemia: Secondary | ICD-10-CM | POA: Diagnosis present

## 2015-12-01 DIAGNOSIS — E785 Hyperlipidemia, unspecified: Secondary | ICD-10-CM | POA: Diagnosis present

## 2015-12-01 LAB — BLOOD GAS, ARTERIAL
ACID-BASE DEFICIT: 1.4 mmol/L (ref 0.0–2.0)
ALLENS TEST (PASS/FAIL): POSITIVE — AB
ALLENS TEST (PASS/FAIL): POSITIVE — AB
Acid-base deficit: 2.4 mmol/L — ABNORMAL HIGH (ref 0.0–2.0)
Bicarbonate: 22.8 mEq/L (ref 21.0–28.0)
Bicarbonate: 21.2 mEq/L (ref 21.0–28.0)
FIO2: 40
FIO2: 0.4
LHR: 18 {breaths}/min
LHR: 16 {breaths}/min
MECHVT: 630 mL
O2 SAT: 96.3 %
O2 SAT: 95.9 %
PATIENT TEMPERATURE: 37
PCO2 ART: 36 mmHg (ref 32.0–48.0)
PCO2 ART: 32 mmHg (ref 32.0–48.0)
PEEP: 5 cmH2O
PEEP: 5 cmH2O
PO2 ART: 79 mmHg — AB (ref 83.0–108.0)
Patient temperature: 37
VT: 500 mL
pH, Arterial: 7.41 (ref 7.350–7.450)
pH, Arterial: 7.43 (ref 7.350–7.450)
pO2, Arterial: 83 mmHg (ref 83.0–108.0)

## 2015-12-01 LAB — COMPREHENSIVE METABOLIC PANEL
ALBUMIN: 3.3 g/dL — AB (ref 3.5–5.0)
ALT: 656 U/L — AB (ref 17–63)
AST: 934 U/L — AB (ref 15–41)
Alkaline Phosphatase: 77 U/L (ref 38–126)
Anion gap: 12 (ref 5–15)
BILIRUBIN TOTAL: 1.1 mg/dL (ref 0.3–1.2)
BUN: 115 mg/dL — AB (ref 6–20)
CHLORIDE: 126 mmol/L — AB (ref 101–111)
CO2: 23 mmol/L (ref 22–32)
CREATININE: 5.78 mg/dL — AB (ref 0.61–1.24)
Calcium: 8.8 mg/dL — ABNORMAL LOW (ref 8.9–10.3)
GFR calc Af Amer: 9 mL/min — ABNORMAL LOW (ref >60)
GFR, EST NON AFRICAN AMERICAN: 8 mL/min — AB (ref >60)
GLUCOSE: 114 mg/dL — AB (ref 65–99)
POTASSIUM: 5.6 mmol/L — AB (ref 3.5–5.1)
Sodium: 161 mmol/L (ref 135–145)
TOTAL PROTEIN: 7.1 g/dL (ref 6.5–8.1)

## 2015-12-01 LAB — CBC WITH DIFFERENTIAL/PLATELET
BASOS ABS: 0 10*3/uL (ref 0–0.1)
Eosinophils Absolute: 0 10*3/uL (ref 0–0.7)
Eosinophils Relative: 0 %
HEMATOCRIT: 42 % (ref 40.0–52.0)
Hemoglobin: 13.4 g/dL (ref 13.0–18.0)
LYMPHS ABS: 3 10*3/uL (ref 1.0–3.6)
Lymphocytes Relative: 21 %
MCH: 30.2 pg (ref 26.0–34.0)
MCHC: 31.9 g/dL — ABNORMAL LOW (ref 32.0–36.0)
MCV: 94.7 fL (ref 80.0–100.0)
MONO ABS: 0.7 10*3/uL (ref 0.2–1.0)
NEUTROS ABS: 10.7 10*3/uL — AB (ref 1.4–6.5)
Neutrophils Relative %: 74 %
Platelets: 87 10*3/uL — ABNORMAL LOW (ref 150–440)
RBC: 4.43 MIL/uL (ref 4.40–5.90)
RDW: 14.9 % — ABNORMAL HIGH (ref 11.5–14.5)
WBC: 14.4 10*3/uL — ABNORMAL HIGH (ref 3.8–10.6)

## 2015-12-01 LAB — URINALYSIS COMPLETE WITH MICROSCOPIC (ARMC ONLY)
BILIRUBIN URINE: NEGATIVE
Glucose, UA: NEGATIVE mg/dL
KETONES UR: NEGATIVE mg/dL
Leukocytes, UA: NEGATIVE
NITRITE: NEGATIVE
PH: 5 (ref 5.0–8.0)
PROTEIN: 100 mg/dL — AB
Specific Gravity, Urine: 1.021 (ref 1.005–1.030)
Squamous Epithelial / LPF: NONE SEEN

## 2015-12-01 LAB — TSH: TSH: 0.442 u[IU]/mL (ref 0.350–4.500)

## 2015-12-01 LAB — PROCALCITONIN: Procalcitonin: 1.59 ng/mL

## 2015-12-01 LAB — VALPROIC ACID LEVEL: Valproic Acid Lvl: <10 ug/mL — ABNORMAL LOW (ref 50.0–100.0)

## 2015-12-01 LAB — SODIUM: SODIUM: 159 mmol/L — AB (ref 135–145)

## 2015-12-01 LAB — LACTIC ACID, PLASMA: Lactic Acid, Venous: 3.8 mmol/L (ref 0.5–2.0)

## 2015-12-01 LAB — RAPID INFLUENZA A&B ANTIGENS
Influenza A (ARMC): NEGATIVE
Influenza B (ARMC): NEGATIVE

## 2015-12-01 LAB — TROPONIN I
TROPONIN I: 0.24 ng/mL — AB (ref <0.031)
Troponin I: 0.25 ng/mL — ABNORMAL HIGH (ref <0.031)
Troponin I: 0.29 ng/mL — ABNORMAL HIGH (ref <0.031)

## 2015-12-01 LAB — PROTIME-INR
INR: 1.35
PROTHROMBIN TIME: 16.8 s — AB (ref 11.4–15.0)

## 2015-12-01 LAB — MRSA PCR SCREENING: MRSA by PCR: POSITIVE — AB

## 2015-12-01 MED ORDER — ALUM & MAG HYDROXIDE-SIMETH 200-200-20 MG/5ML PO SUSP: 30.0000 mL | Freq: Four times a day (QID) | ORAL | Status: DC | PRN

## 2015-12-01 MED ORDER — ONDANSETRON HCL 4 MG PO TABS: 4.0000 mg | ORAL_TABLET | Freq: Four times a day (QID) | ORAL | Status: DC | PRN

## 2015-12-01 MED ORDER — FENTANYL CITRATE (PF) 100 MCG/2ML IJ SOLN: 50.0000 ug | INTRAMUSCULAR | Status: DC | PRN

## 2015-12-01 MED ORDER — ACETAMINOPHEN 650 MG RE SUPP: 650.0000 mg | Freq: Four times a day (QID) | RECTAL | Status: DC | PRN

## 2015-12-01 MED ORDER — ASPIRIN 300 MG RE SUPP: 300.0000 mg | Freq: Every day | RECTAL | Status: DC

## 2015-12-01 MED ORDER — SODIUM CHLORIDE 0.9 % IV BOLUS (SEPSIS)
1000.0000 mL | Freq: Once | INTRAVENOUS | Status: AC
  Administered 2015-12-01: 1000 mL via INTRAVENOUS

## 2015-12-01 MED ORDER — ONDANSETRON HCL 4 MG/2ML IJ SOLN: 4.0000 mg | Freq: Four times a day (QID) | INTRAMUSCULAR | Status: DC | PRN

## 2015-12-01 MED ORDER — ACETAMINOPHEN 325 MG PO TABS: 650.0000 mg | ORAL_TABLET | Freq: Four times a day (QID) | ORAL | Status: DC | PRN

## 2015-12-01 MED ORDER — MIDAZOLAM HCL 2 MG/2ML IJ SOLN: 1.0000 mg | INTRAMUSCULAR | Status: DC | PRN

## 2015-12-01 MED ORDER — ANTISEPTIC ORAL RINSE SOLUTION (CORINZ)
7.0000 mL | Freq: Four times a day (QID) | OROMUCOSAL | Status: DC
  Administered 2015-12-01 – 2015-12-02 (×3): 7 mL via OROMUCOSAL
  Filled 2015-12-01 (×6): qty 7

## 2015-12-01 MED ORDER — CITALOPRAM HYDROBROMIDE 20 MG PO TABS
20.0000 mg | ORAL_TABLET | Freq: Every day | ORAL | Status: DC
  Administered 2015-12-02: 20 mg via ORAL
  Filled 2015-12-01: qty 1

## 2015-12-01 MED ORDER — DIVALPROEX SODIUM 250 MG PO DR TAB: 250.0000 mg | DELAYED_RELEASE_TABLET | Freq: Three times a day (TID) | ORAL | Status: DC

## 2015-12-01 MED ORDER — VANCOMYCIN HCL IN DEXTROSE 1-5 GM/200ML-% IV SOLN
1000.0000 mg | Freq: Once | INTRAVENOUS | Status: AC
  Administered 2015-12-01: 1000 mg via INTRAVENOUS
  Filled 2015-12-01: qty 200

## 2015-12-01 MED ORDER — PIPERACILLIN-TAZOBACTAM 3.375 G IVPB
3.3750 g | Freq: Two times a day (BID) | INTRAVENOUS | Status: DC
  Administered 2015-12-01 – 2015-12-02 (×2): 3.375 g via INTRAVENOUS
  Filled 2015-12-01 (×3): qty 50

## 2015-12-01 MED ORDER — MIDAZOLAM HCL 2 MG/2ML IJ SOLN
1.0000 mg | INTRAMUSCULAR | Status: DC | PRN
  Administered 2015-12-02: 1 mg via INTRAVENOUS
  Filled 2015-12-01: qty 2

## 2015-12-01 MED ORDER — SENNOSIDES-DOCUSATE SODIUM 8.6-50 MG PO TABS: 1.0000 | ORAL_TABLET | Freq: Every evening | ORAL | Status: DC | PRN

## 2015-12-01 MED ORDER — MUPIROCIN 2 % EX OINT
1.0000 "application " | TOPICAL_OINTMENT | Freq: Two times a day (BID) | CUTANEOUS | Status: DC
  Administered 2015-12-01 – 2015-12-02 (×2): 1 via NASAL
  Filled 2015-12-01: qty 22

## 2015-12-01 MED ORDER — SUCCINYLCHOLINE CHLORIDE 20 MG/ML IJ SOLN
100.0000 mg | Freq: Once | INTRAMUSCULAR | Status: AC
  Administered 2015-12-01: 100 mg via INTRAVENOUS

## 2015-12-01 MED ORDER — DEXTROSE 5 % IV SOLN
INTRAVENOUS | Status: DC
  Administered 2015-12-01 – 2015-12-02 (×3): via INTRAVENOUS

## 2015-12-01 MED ORDER — ENOXAPARIN SODIUM 40 MG/0.4ML ~~LOC~~ SOLN: 40.0000 mg | SUBCUTANEOUS | Status: DC

## 2015-12-01 MED ORDER — ATORVASTATIN CALCIUM 20 MG PO TABS
20.0000 mg | ORAL_TABLET | Freq: Every day | ORAL | Status: DC
  Administered 2015-12-01: 20 mg via ORAL
  Filled 2015-12-01: qty 1

## 2015-12-01 MED ORDER — CHLORHEXIDINE GLUCONATE CLOTH 2 % EX PADS
6.0000 | MEDICATED_PAD | Freq: Every day | CUTANEOUS | Status: DC
  Administered 2015-12-02: 6 via TOPICAL

## 2015-12-01 MED ORDER — FENTANYL CITRATE (PF) 100 MCG/2ML IJ SOLN
INTRAMUSCULAR | Status: AC
  Filled 2015-12-01: qty 2

## 2015-12-01 MED ORDER — SODIUM CHLORIDE 0.45 % IV SOLN: INTRAVENOUS | Status: DC

## 2015-12-01 MED ORDER — FENTANYL CITRATE (PF) 100 MCG/2ML IJ SOLN
50.0000 ug | INTRAMUSCULAR | Status: DC | PRN
  Administered 2015-12-01: 50 ug via INTRAVENOUS

## 2015-12-01 MED ORDER — SODIUM CHLORIDE 0.9% FLUSH
3.0000 mL | Freq: Two times a day (BID) | INTRAVENOUS | Status: DC
  Administered 2015-12-01 – 2015-12-02 (×2): 3 mL via INTRAVENOUS

## 2015-12-01 MED ORDER — FAMOTIDINE IN NACL 20-0.9 MG/50ML-% IV SOLN: 20.0000 mg | Freq: Two times a day (BID) | INTRAVENOUS | Status: DC

## 2015-12-01 MED ORDER — CHLORHEXIDINE GLUCONATE 0.12% ORAL RINSE (MEDLINE KIT)
15.0000 mL | Freq: Two times a day (BID) | OROMUCOSAL | Status: DC
  Administered 2015-12-01 – 2015-12-02 (×2): 15 mL via OROMUCOSAL
  Filled 2015-12-01 (×4): qty 15

## 2015-12-01 MED ORDER — ASPIRIN 300 MG RE SUPP
300.0000 mg | Freq: Every day | RECTAL | Status: DC
Start: 2015-12-01 — End: 2015-12-02
  Administered 2015-12-01: 300 mg via RECTAL
  Filled 2015-12-01 (×2): qty 1

## 2015-12-01 MED ORDER — HEPARIN SODIUM (PORCINE) 5000 UNIT/ML IJ SOLN
5000.0000 [IU] | Freq: Two times a day (BID) | INTRAMUSCULAR | Status: DC
  Administered 2015-12-01 – 2015-12-02 (×2): 5000 [IU] via SUBCUTANEOUS
  Filled 2015-12-01 (×2): qty 1

## 2015-12-01 MED ORDER — PIPERACILLIN-TAZOBACTAM 3.375 G IVPB 30 MIN
3.3750 g | Freq: Once | INTRAVENOUS | Status: AC
  Administered 2015-12-01: 3.375 g via INTRAVENOUS
  Filled 2015-12-01: qty 50

## 2015-12-01 MED ORDER — VALPROATE SODIUM 250 MG/5ML PO SYRP
250.0000 mg | ORAL_SOLUTION | Freq: Three times a day (TID) | ORAL | Status: DC
  Administered 2015-12-01 – 2015-12-02 (×3): 250 mg
  Filled 2015-12-01 (×6): qty 5

## 2015-12-01 MED ORDER — PANTOPRAZOLE SODIUM 40 MG IV SOLR
40.0000 mg | Freq: Every day | INTRAVENOUS | Status: DC
  Administered 2015-12-01 – 2015-12-02 (×2): 40 mg via INTRAVENOUS
  Filled 2015-12-01 (×2): qty 40

## 2015-12-01 NOTE — Progress Notes (Signed)
eLink Physician-Brief Progress Note Patient Name: Jacob Clark DOB: 01-28-1930 MRN: [id number]   Date of Service  11/30/2015  HPI/Events of Note  Hypotension Chart reviewed, discussed with attending, advanced dementia  eICU Interventions  Monitor, no eICU intervention Continue discussing goals of care with patient's family     Intervention Category Intermediate Interventions: Hypotension - evaluation and management  Max FickleDouglas Jazlyne Gauger 12/05/2015, 9:11 PM

## 2015-12-01 NOTE — Progress Notes (Signed)
Pharmacy Antibiotic Note  Jacob Clark is a 82 y.o. male admitted on 2016/06/17 with sepsis.  Pharmacy has been consulted for vancomycin and zosyn dosing.  Plan: Patient with AKI, SCr: 5.78.  Received vancomycin 1gm x 1 and zosyn 3.375gm IV x 1 in ED.   Will follow with zosyn 3.375gm IV Q12H for CrCl < 9120ml/min  No additional vancomycin dose ordered at this time as half life ~ 54hrs. Will re-evaluate renal function with AM labs and dose as indicated.  Weight: 165 lb 5.5 oz (75 kg)   Temp (24hrs), Avg:100.8 F (38.2 Clark), Min:100.8 F (38.2 Clark), Max:100.8 F (38.2 Clark)   Recent Labs Lab September 15, 2016 1104  WBC 14.4*  CREATININE 5.78*  LATICACIDVEN 3.8*    Estimated Creatinine Clearance: 10.1 mL/min (by Clark-G formula based on Cr of 5.78).    Allergies  Allergen Reactions  . Alprazolam Nausea And Vomiting  . Memantine Hcl Nausea And Vomiting and Swelling  . Memantine Rash    Other reaction(s): SWELLING/EDEMA    Antimicrobials this admission: Vancomycin 3/16 >>   Zosyn 3/16 >>   Dose adjustments this admission:   Microbiology results: 3/16 BCx:   Thank you for allowing pharmacy to be a part of this patient's care.  Jacob Clark 2016/06/17 2:02 PM

## 2015-12-01 NOTE — Plan of Care (Signed)
Problem: Phase I Progression Outcomes Goal: VTE prophylaxis Outcome: Completed/Met Date Met:  12/03/2015 ASA and SCD's  ordered Goal: GIProphysixis Outcome: Completed/Met Date Met:  12/15/2015 protonix iv Goal: Oral Care per Protocol Outcome: Completed/Met Date Met:  11/17/2015 Oral care set Goal: Sedation Protocol initiated if indicated Outcome: Completed/Met Date Met:  12/09/2015 Prn versed and fentanyl orders Goal: Pneumonia/flu vaccination screen completed Outcome: Completed/Met Date Met:  12/04/2015 Up to date vaccines Goal: Code status addressed with pt/family Outcome: Completed/Met Date Met:  11/24/2015 Family wishes pt made a DNR after discussion with Dr Alva Garnet Goal: Pain controlled with appropriate interventions Outcome: Progressing Fentanyl  appears to decrease pain   Goal: Hemodynamically stable Outcome: Progressing Tem 100.1. Bp low after fentanyl given.  No pressors. Scant uop Goal: Baseline oxygen/pH stable Outcome: Progressing Good sats on vent:  40% fio2, rate 16, 5 peep Goal: Patient tolerating nututrition at goal Outcome: Not Progressing OGT clamped. Decreased bowel sounds

## 2015-12-01 NOTE — H&P (Addendum)
[facility] - [place] at [facility]   PATIENT NAME: Jacob Clark    MR#:  [id number]  DATE OF BIRTH:  December 28, 1929  DATE OF ADMISSION:  11/19/2015  PRIMARY CARE PHYSICIAN: Jerl Mina, MD   REQUESTING/REFERRING PHYSICIAN: Dr Huel Cote  CHIEF COMPLAINT:   unresponsiveness HISTORY OF PRESENT ILLNESS:  Jacob Clark  is a 82 y.o. male with a known history of HTN, CVA and hemorrhagic CVA who presents from [facility] with unresponsiveness and hypotension. Patient has been declining in mental status for the past 23 days. EMS reported blood pressure 72/52 and oxygen saturations in the 60s. He was placed on non rebreather and sent to the ER for further evaluation In the emergency room he was intubated for hypoxic respiratory failure and failure to protect his airways. He was noted have pinpoint pupils,unresponsiveess, posturing and doll's eyes. He was started on ZOSYN and VANCOMYCIN and given IVF for hypotension. CT Head shows small non hemmorhagic CVA. He ws ordered ASA PR  PAST MEDICAL HISTORY:   Past Medical History  Diagnosis Date  . Hypertension   . Stroke (HCC)   . Hyperlipemia   . OAB (overactive bladder)   . Scrotal mass   . Elevated PSA   . Phimosis   . BP (high blood pressure) 05/31/2015  . Cerebral vascular accident (HCC) 05/31/2015  . Essential (primary) hypertension 12/23/2013    PAST SURGICAL HISTORY:   Past Surgical History  Procedure Laterality Date  . Colonoscopy w/ polypectomy  1976    SOCIAL HISTORY:   Social History  Substance Use Topics  . Smoking status: Never Smoker   . Smokeless tobacco: Never Used  . Alcohol Use: No    FAMILY HISTORY:   Family History  Problem Relation Age of Onset  . Bladder Cancer Neg Hx   . Kidney cancer Neg Hx   . Prostate cancer Neg Hx     DRUG ALLERGIES:   Allergies  Allergen Reactions  . Alprazolam Nausea And Vomiting  . Memantine Hcl Nausea And Vomiting and Swelling  . Memantine  Rash    Other reaction(s): SWELLING/EDEMA     REVIEW OF SYSTEMS:  Patient intubated   MEDICATIONS AT HOME:   Prior to Admission medications   Medication Sig Start Date End Date Taking? Authorizing Provider  amLODipine (NORVASC) 10 MG tablet Take 10 mg by mouth daily.   Yes Historical Provider, MD  Apoaequorin (PREVAGEN PO) Take 1 tablet by mouth daily.    Yes Historical Provider, MD  Cholecalciferol (VITAMIN D-1000 MAX ST) 1000 UNITS tablet Take 1,000 Units by mouth daily.    Yes Historical Provider, MD  citalopram (CELEXA) 10 MG tablet Take 20 mg by mouth daily.   Yes Historical Provider, MD  divalproex (DEPAKOTE) 125 MG DR tablet Take 250 mg by mouth 3 (three) times daily.  11/28/15  Yes Historical Provider, MD  Ginkgo Biloba (SM GINKGO BILOBA) 60 MG TABS Take 1 tablet by mouth daily.    Yes Historical Provider, MD  LORazepam (ATIVAN) 0.5 MG tablet Take 0.5 mg by mouth every 8 (eight) hours as needed for anxiety (and agitation).  11/03/15  Yes Historical Provider, MD  Multiple Vitamin (MULTI-VITAMINS) TABS Take 1 tablet by mouth daily.    Yes Historical Provider, MD  Naproxen Sodium (ALEVE) 220 MG CAPS Take 220 mg by mouth 2 (two) times daily as needed (for moderate pain).    Yes Historical Provider, MD  Olive Leaf Extract 250 MG CAPS Take 1 capsule by  mouth daily.    Yes Historical Provider, MD  propranolol (INDERAL) 40 MG tablet Take 40 mg by mouth 2 (two) times daily.   Yes Historical Provider, MD  rivastigmine (EXELON) 9.5 mg/24hr Place 9.5 mg onto the skin daily.  07/18/15  Yes Historical Provider, MD  Thiamine HCl (VITAMIN B-1) 250 MG tablet Take 250 mg by mouth daily.    Yes Historical Provider, MD      VITAL SIGNS:  Blood pressure 91/64, pulse 74, temperature 100.8 F (38.2 C), temperature source Rectal, resp. rate 27, weight 75 kg (165 lb 5.5 oz), SpO2 100 %.  PHYSICAL EXAMINATION:  GENERAL:  82 y.o.-year-old patient lying in the bed intubated with posturing EYES: Pupils  sluggish 2 mm b/l. No scleral icterus. HEENT: Head atraumatic, normocephalic. intubated NECK:  Supple, no jugular venous distention. No thyroid enlargement, no tenderness.  LUNGS: Normal breath sounds bilaterally, no wheezing, rales,rhonchi or crepitation. No use of accessory muscles of respiration.  CARDIOVASCULAR: S1, S2 normal. No murmurs, rubs, or gallops.  ABDOMEN: Soft, nontender, nondistended. Bowel sounds present. No organomegaly or mass.  EXTREMITIES: No pedal edema, cyanosis, or clubbing.  NEUROLOGIC: intubated sedated PSYCHIATRIC: The patient is sedated.  SKIN: No obvious rash, lesion, or ulcer.   LABORATORY PANEL:   CBC  Recent Labs Lab 12/04/2015 1104  WBC 14.4*  HGB 13.4  HCT 42.0  PLT PENDING   ------------------------------------------------------------------------------------------------------------------  Chemistries   Recent Labs Lab 11/30/2015 1104  NA 161*  K 5.6*  CL 126*  CO2 23  GLUCOSE 114*  BUN 115*  CREATININE 5.78*  CALCIUM 8.8*  AST 934*  ALT 656*  ALKPHOS 77  BILITOT 1.1   ------------------------------------------------------------------------------------------------------------------  Cardiac Enzymes  Recent Labs Lab 11/24/2015 1104  TROPONINI 0.25*   ------------------------------------------------------------------------------------------------------------------  RADIOLOGY:  Ct Head Wo Contrast  11/27/2015  CLINICAL DATA:  Unresponsive EXAM: CT HEAD WITHOUT CONTRAST TECHNIQUE: Contiguous axial images were obtained from the base of the skull through the vertex without intravenous contrast. COMPARISON:  11/23/2014 FINDINGS: Bony calvarium is intact. Atrophic and chronic white matter ischemic changes are seen. Somewhat rounded area of decreased attenuation is noted in the right parietal-occipital lobe best seen on image number 14 of series 2. This measures 16 mm and is consistent with a small focus of likely acute ischemia. No findings  to suggest acute hemorrhage or space-occupying mass lesion are noted. IMPRESSION: Chronic atrophic and ischemic changes. Likely small area of acute ischemia in the right parieto-occipital lobe as described. Electronically Signed   By: Alcide Clever M.D.   On: 12/13/2015 12:42   Dg Chest Port 1 View  12/09/2015  CLINICAL DATA:  Intubated. EXAM: PORTABLE CHEST 1 VIEW COMPARISON:  None. FINDINGS: The endotracheal to is 3.8 cm above the carina. The NG tube is coursing down the esophagus and into the stomach. The cardiac silhouette, mediastinal and hilar contours are within normal limits for age. There is moderate tortuosity and calcification of the thoracic aorta. The pulmonary hila appear normal. Moderate eventration of the right hemidiaphragm with some overlying vascular crowding and atelectasis. No edema, infiltrates, pleural effusion or pneumothorax. The bony thorax is intact. IMPRESSION: The endotracheal tube and NG tubes are in good position. No acute pulmonary findings. Electronically Signed   By: Rudie Meyer M.D.   On: 12/15/2015 11:51    EKG:   NSR no ST elevatation  IMPRESSION AND PLAN:    82 year old male with recent hemorrhagic CVA in December, dementia and hypertension who presents from nursing home with  unresponsiveness and hypotension. CT scan of the head does show an acute nonhemorrhagic stroke.  1. Sepsis: Patient presents with fever, hypotension and leukocytosis. Patient is on empiric Zosyn and vancomycin which I will continue. Follow up on urine and blood cultures. Continue to monitor lactic acid.  2. Acute hypoxic respiratory failure: Patient was intubated in order to protect airway. Chest x-ray does not show evidence of pneumonia or pulmonary findings. Continue ventilator settings. Intensivist consultation placed and discussed.  3. Acute right parieto-occipital stroke, nonhemorrhagic history of hemorrhagic CVA in December.: Neurology consultation. Neuro checks every 4  hours. Aspirin PR daily. Statin as tolerated.  4. Severe hypernatremia: This is due to poor by mouth intake. Continue IV fluids and repeat sodium levels every 6 hours. Consult nephrology for further recommendations and evaluation.   5. Hypotension: Due to sepsis. Hold amlodipine and continue IV fluids. Continue to monitor blood pressure. Keep map greater than 65.  6. Dementia: Exelon patch can be restarted once patient is extubated.  7. Hyperkalemia: This is in the setting of acute renal failure. Treat potassium and repeat H Knight.  8. Acute renal failure: This is due to dehydration and poor by mouth intake. Neurology consultation placed. Further recommendations after nephrology consultation.  9. Elevated troponin: This is likely due to demand ischemia. Continue to follow troponins. Consult cardiology.  Patient is critically ill and very high risk of cardiopulmonary arrest and multiorgan failure. Consider palliative care consult. All the records are reviewed and case discussed with ED provider. Management plans discussed with the patient's wife and she is in agreement.  CODE STATUS: FULL for now  CRITICAL CARE TOTAL TIME TAKING CARE OF THIS PATIENT: 65 minutes.    Tyronne Blann M.D on 12/14/2015 at 1:32 PM  Between 7am to 6pm - Pager - [phone] After 6pm go to [email or web address] - password [email or web address] [facility]  [facility] [facility]  Office  [phone]  CC: Primary care physician; Jerl MinaHEDRICK, JAMES, MD

## 2015-12-01 NOTE — ED Notes (Signed)
Pt transported to ICU with RN and Rt

## 2015-12-01 NOTE — Consult Note (Signed)
PULMONARY / CRITICAL CARE MEDICINE   Name: Jacob Clark MRN: [id number] DOB: 04/23/1930    ADMISSION DATE:  11/24/2015 CONSULTATION DATE:  03/16  REFERRING MD:  Juliene PinaMody  PT PROFILE:  [age] M with advance dementia and accelerated functional decline since 08/2015  Admitted from NH via ED where he was intubated for depressed LOC, hypotension and hypoxemia. Na 161, Cr 5.78. Pt previously had Living Will and wife desires DNR if he arrests. No HD under any circumstances  HISTORY OF PRESENT ILLNESS:   As above. Pt has been rapidly declining over past several months to point of becoming mostly nonverbal. His PO intake has been poor.   PAST MEDICAL HISTORY :  He  has a past medical history of Hypertension; Stroke (HCC); Hyperlipemia; OAB (overactive bladder); Scrotal mass; Elevated PSA; Phimosis; BP (high blood pressure) (05/31/2015); Cerebral vascular accident [facility](HCC) (05/31/2015); and Essential (primary) hypertension (12/23/2013).  PAST SURGICAL HISTORY: He  has past surgical history that includes Colonoscopy w/ polypectomy (1976).  Allergies  Allergen Reactions  . Alprazolam Nausea And Vomiting  . Memantine Hcl Nausea And Vomiting and Swelling  . Memantine Rash    Other reaction(s): SWELLING/EDEMA    No current facility-administered medications on file prior to encounter.   Current Outpatient Prescriptions on File Prior to Encounter  Medication Sig  . Apoaequorin (PREVAGEN PO) Take 1 tablet by mouth daily.   . Cholecalciferol (VITAMIN D-1000 MAX ST) 1000 UNITS tablet Take 1,000 Units by mouth daily.   . Ginkgo Biloba (SM GINKGO BILOBA) 60 MG TABS Take 1 tablet by mouth daily.   . Multiple Vitamin (MULTI-VITAMINS) TABS Take 1 tablet by mouth daily.   . Naproxen Sodium (ALEVE) 220 MG CAPS Take 220 mg by mouth 2 (two) times daily as needed (for moderate pain).   Gracelyn Nurse. Olive Leaf Extract 250 MG CAPS Take 1 capsule by mouth daily.   . rivastigmine (EXELON) 9.5 mg/24hr Place 9.5 mg onto the skin  daily.   . Thiamine HCl (VITAMIN B-1) 250 MG tablet Take 250 mg by mouth daily.     FAMILY HISTORY:  His has no family status information on file.   SOCIAL HISTORY: He  reports that he has never smoked. He has never used smokeless tobacco. He reports that he does not drink alcohol or use illicit drugs.  REVIEW OF SYSTEMS:   Cannot obtain  SUBJECTIVE:    VITAL SIGNS: BP 124/80 mmHg  Pulse 75  Temp(Src) 100.1 F (37.8 C) (Oral)  Resp 23  Ht 6\' 1"  (1.854 m)  Wt 75.8 kg (167 lb 1.7 oz)  BMI 22.05 kg/m2  SpO2 96%  HEMODYNAMICS:    VENTILATOR SETTINGS: Vent Mode:  [-] PRVC FiO2 (%):  [40 %] 40 % Set Rate:  [16 bmp-18 bmp] 16 bmp Vt Set:  [500 mL-630 mL] 630 mL PEEP:  [5 cmH20] 5 cmH20 Plateau Pressure:  [9 cmH20] 9 cmH20  INTAKE / OUTPUT:    PHYSICAL EXAMINATION: General: RASS -5, intubated Neuro: PERRL, EOMI, MAEs, DTRs symmetric HEENT: NCAT Cardiovascular: Reg, no M Lungs: Clear, no adventitious sounds Abdomen: Soft, NT, + BS Ext: warm, no edema Skin: no lesions noted  LABS:  BMET  Recent Labs Lab 12/05/2015 1104  NA 161*  K 5.6*  CL 126*  CO2 23  BUN 115*  CREATININE 5.78*  GLUCOSE 114*    Electrolytes  Recent Labs Lab 12/09/2015 1104  CALCIUM 8.8*    CBC  Recent Labs Lab 12/04/2015 1104  WBC 14.4*  HGB  13.4  HCT 42.0  PLT 87*    Coag's  Recent Labs Lab 12/16/2015 1104  INR 1.35    Sepsis Markers  Recent Labs Lab 11/30/2015 1104  LATICACIDVEN 3.8*    ABG  Recent Labs Lab 11/24/2015 1102 11/23/2015 1530  PHART 7.41 7.43  PCO2ART 36 32  PO2ART 83 79*    Liver Enzymes  Recent Labs Lab 11/17/2015 1104  AST 934*  ALT 656*  ALKPHOS 77  BILITOT 1.1  ALBUMIN 3.3*    Cardiac Enzymes  Recent Labs Lab 11/29/2015 1104  TROPONINI 0.25*    Glucose No results for input(s): GLUCAP in the last 168 hours.  Imaging Ct Head Wo Contrast    CLINICAL DATA:  Unresponsive EXAM: CT HEAD WITHOUT CONTRAST TECHNIQUE:  Contiguous axial images were obtained from the base of the skull through the vertex without intravenous contrast. COMPARISON:  11/23/2014 FINDINGS: Bony calvarium is intact. Atrophic and chronic white matter ischemic changes are seen. Somewhat rounded area of decreased attenuation is noted in the right parietal-occipital lobe best seen on image number 14 of series 2. This measures 16 mm and is consistent with a small focus of likely acute ischemia. No findings to suggest acute hemorrhage or space-occupying mass lesion are noted. IMPRESSION: Chronic atrophic and ischemic changes. Likely small area of acute ischemia in the right parieto-occipital lobe as described. Electronically Signed   By: Alcide Clever M.D.   On:  12:42   Dg Chest Port 1 View    CLINICAL DATA:  Intubated. EXAM: PORTABLE CHEST 1 VIEW COMPARISON:  None. FINDINGS: The endotracheal to is 3.8 cm above the carina. The NG tube is coursing down the esophagus and into the stomach. The cardiac silhouette, mediastinal and hilar contours are within normal limits for age. There is moderate tortuosity and calcification of the thoracic aorta. The pulmonary hila appear normal. Moderate eventration of the right hemidiaphragm with some overlying vascular crowding and atelectasis. No edema, infiltrates, pleural effusion or pneumothorax. The bony thorax is intact. IMPRESSION: The endotracheal tube and NG tubes are in good position. No acute pulmonary findings. Electronically Signed   By: Rudie Meyer M.D.   On: 11/29/2015 11:51     ASSESSMENT / PLAN:  PULMONARY A: Ventilator dependent respiratory failure - suspect due to AMS Doubt PNA P:   Vent settings established Vent bundle implemented Daily SBT as indicated If not substantially improving by AM 3/17, will consider terminal extubation  CARDIOVASCULAR A:  Hypovolemic hypotension - improved after IVFs P:  DNR NS X 1 more liter ordered Monitor No vasopressors per  conversation with pt's wife  RENAL A:   AKI Severe hypernatremia/free water deficit P:   Monitor BMET intermittently Monitor I/Os Correct electrolytes as indicated No HD per conversation with pt's wife  GASTROINTESTINAL A:   Protein-calorie malnutrition P:   SUP: IV PPI Consider TFs AM 03/17  HEMATOLOGIC A:   Thrombocytopenia  P:  DVT px: SQ heparin Monitor CBC intermittently Transfuse per usual guidelines  INFECTIOUS A:   Doubt acute infection P:   Monitor temp, WBC count Micro and abx as above PCT algorithm  ENDOCRINE A:   Mild hyperglycemia without prior DM P:   Monitor glu on chem panels Consider SSI for gu > 180  NEUROLOGIC A:   Coma - due to hypernatremia Possible acute right parieto-occipital CVA Severe baseline dementia P:   RASS goal: -1, -2 PAD protocol   FAMILY  - Updates: current status and goals of care discussed with  wife in detail  CCM time: 72 The above time includes time spent in consultation with patient and/or family members and reviewing care plan on multidisciplinary rounds  Billy Fischer, MD PCCM service Mobile [phone] Pager [phone]     12/28/15, 3:49 PM

## 2015-12-01 NOTE — ED Notes (Addendum)
[facility], SNF states pt has been declining for the past 2-3 days, hx of hemorhagic CVA, EMs reports BP 72/52, o2 in the 60s on 15L NRB, pt arrives posturing, pinpoint pupils unresponsive, MD and RT at bedside, pt bagged upon arrival and intubated

## 2015-12-01 NOTE — ED Notes (Signed)
Dr. Huel CoteQuigley notified of critical NA and trop

## 2015-12-01 NOTE — Progress Notes (Signed)
Pt remains hypotensive with SBP in 80's. eLink MD made aware. MD also updated on Pt's 5.5 second run of SVT. No new orders given. Will continue to monitor.

## 2015-12-01 NOTE — ED Notes (Addendum)
Dr. Huel CoteQuigley notified of critical lactic acid of 3.8, Dr. Huel CoteQuigley also notified of BP 80/58, order for fluid bolus received, wife at bedside

## 2015-12-01 NOTE — ED Notes (Signed)
Pt intubated by Dr. Huel CoteQuigley and RT

## 2015-12-01 NOTE — ED Notes (Signed)
Several attempts to obtain blood cultures without success, antibiotics to be hung and blood cultures obtained later

## 2015-12-01 NOTE — Progress Notes (Signed)
I spoke with Dr. Darrol AngelSimons who spoke with the patient's family. For now we will discontinue nephrology and neurology consultations. Further evaluation after 24 hours.

## 2015-12-01 NOTE — ED Provider Notes (Signed)
Time Seen: Approximately 1055  I have reviewed the triage notes  Chief Complaint: Respiratory Distress and Altered Mental Status   History of Present Illness: Jacob Clark is a 82 y.o. male who was transported here by EMS with a limited history from nursing facility of altered mental status. Patient apparently is "" declining "" for the past 2-3 days. EMS states he found the patient with pulse ox in the 50s to 60s and was placed on 100% nonrebreather. Field blood pressure was low at 72/52. Patient himself is not able to offer any history or review of systems is currently a Glasgow Coma Scale of 5. Patient appears to be the cerebral with clenching of the jaws and dolls eyes. Pupils are pinpoint.   Past Medical History  Diagnosis Date  . Hypertension   . Stroke (HCC)   . Hyperlipemia   . OAB (overactive bladder)   . Scrotal mass   . Elevated PSA   . Phimosis   . BP (high blood pressure) 05/31/2015  . Cerebral vascular accident (HCC) 05/31/2015  . Essential (primary) hypertension 12/23/2013    Patient Active Problem List   Diagnosis Date Noted  . Respiratory arrest (HCC) 08-02-2016  . Cerebrovascular accident (CVA) (HCC) 08/04/2015  . Allergic state 05/31/2015  . Cerebrovascular accident, old 05/31/2015  . HLD (hyperlipidemia) 05/31/2015  . BP (high blood pressure) 05/31/2015  . Detrusor muscle hypertonia 05/31/2015  . Phimosis 05/31/2015  . Lump in scrotum 05/31/2015  . Cerebral vascular accident (HCC) 05/31/2015  . History of migraine headaches 12/23/2013  . Essential (primary) hypertension 12/23/2013  . Abnormal prostate specific antigen 07/07/2012  . Benign prostatic hyperplasia with urinary obstruction 07/07/2012  . Excessive urination at night 07/07/2012  . Urge incontinence 07/07/2012  . FOM (frequency of micturition) 07/07/2012  . Elevated prostate specific antigen (PSA) 07/07/2012    Past Surgical History  Procedure Laterality Date  . Colonoscopy w/  polypectomy  1976    Past Surgical History  Procedure Laterality Date  . Colonoscopy w/ polypectomy  1976    No current outpatient prescriptions on file.  Allergies:  Alprazolam; Memantine hcl; and Memantine  Family History: Family History  Problem Relation Age of Onset  . Bladder Cancer Neg Hx   . Kidney cancer Neg Hx   . Prostate cancer Neg Hx     Social History: Social History  Substance Use Topics  . Smoking status: Never Smoker   . Smokeless tobacco: Never Used  . Alcohol Use: No     Review of Systems:   10 point review of systems was performed and was otherwise negative: Review of systems difficult to obtain but mainly acquired from the nursing facility and EMS Constitutional: No fever Eyes: No visual disturbances ENT: No sore throat, ear pain Cardiac: No chest pain Respiratory: No shortness of breath, wheezing, or stridor Abdomen: No abdominal pain, no vomiting, No diarrhea Endocrine: No weight loss, No night sweats Extremities: No peripheral edema, cyanosis Skin: No rashes, easy bruising Neurologic: No focal weakness, trouble with speech or swollowing Urologic: No dysuria, Hematuria, or urinary frequency * Physical Exam:  ED Triage Vitals  Enc Vitals Group     BP Jun 12, 2016 1056 122/76 mmHg     Pulse Rate Jun 12, 2016 1056 76     Resp Jun 12, 2016 1215 25     Temp Jun 12, 2016 1116 100.8 F (38.2 C)     Temp Source Jun 12, 2016 1116 Rectal     SpO2 Jun 12, 2016 1215 100 %  Weight 11/19/2015 1056 165 lb 5.5 oz (75 kg)     Height --      Head Cir --      Peak Flow --      Pain Score --      Pain Loc --      Pain Edu? --      Excl. in GC? --     General: GCS of 5. Head: Normal cephalic , atraumatic Eyes: Pupils small equal and unreactive Nose/Throat: No nasal drainage, patent upper airway without erythema or exudate.  Neck: Supple, Full range of motion, No anterior adenopathy or palpable thyroid masses Lungs: Clear to ascultation without wheezes , rhonchi, or  rales Heart: Regular rate, regular rhythm without murmurs , gallops , or rubs Abdomen: Soft, non tender without rebound, guarding , or rigidity; bowel sounds positive and symmetric in all 4 quadrants. No organomegaly .        Extremities: 2 plus symmetric pulses. No edema, clubbing or cyanosis Neurologic: Decerebrate  Skin: warm, dry, no rashes   Labs:   All laboratory work was reviewed including any pertinent negatives or positives listed below:  Labs Reviewed  BLOOD GAS, ARTERIAL - Abnormal; Notable for the following:    Allens test (pass/fail) POSITIVE (*)    All other components within normal limits  CBC WITH DIFFERENTIAL/PLATELET - Abnormal; Notable for the following:    WBC 14.4 (*)    MCHC 31.9 (*)    RDW 14.9 (*)    Platelets 87 (*)    Neutro Abs 10.7 (*)    All other components within normal limits  COMPREHENSIVE METABOLIC PANEL - Abnormal; Notable for the following:    Sodium 161 (*)    Potassium 5.6 (*)    Chloride 126 (*)    Glucose, Bld 114 (*)    BUN 115 (*)    Creatinine, Ser 5.78 (*)    Calcium 8.8 (*)    Albumin 3.3 (*)    AST 934 (*)    ALT 656 (*)    GFR calc non Af Amer 8 (*)    GFR calc Af Amer 9 (*)    All other components within normal limits  PROTIME-INR - Abnormal; Notable for the following:    Prothrombin Time 16.8 (*)    All other components within normal limits  TROPONIN I - Abnormal; Notable for the following:    Troponin I 0.25 (*)    All other components within normal limits  URINALYSIS COMPLETEWITH MICROSCOPIC ([facility] ONLY) - Abnormal; Notable for the following:    Color, Urine AMBER (*)    APPearance CLOUDY (*)    Hgb urine dipstick 3+ (*)    Protein, ur 100 (*)    Bacteria, UA RARE (*)    All other components within normal limits  VALPROIC ACID LEVEL - Abnormal; Notable for the following:    Valproic Acid Lvl <10 (*)    All other components within normal limits  LACTIC ACID, PLASMA - Abnormal; Notable for the following:    Lactic  Acid, Venous 3.8 (*)    All other components within normal limits  RAPID INFLUENZA A&B ANTIGENS ([facility] ONLY)  CULTURE, BLOOD (ROUTINE X 2)  CULTURE, BLOOD (ROUTINE X 2)  CULTURE, RESPIRATORY (NON-EXPECTORATED)  SODIUM  SODIUM  TSH  TROPONIN I  TROPONIN I  TROPONIN I  PROCALCITONIN  BLOOD GAS, ARTERIAL   Numerous significant abnormalities seen all laboratory work with renal failure. Possible urinary tract infection. EKG:  ED ECG  REPORT I, Jennye Moccasin, the attending physician, personally viewed and interpreted this ECG.  Date: 12-27-2015 EKG Time: 1205 Rate: 75 Rhythm: normal sinus rhythm QRS Axis: normal Intervals: normal ST/T Wave abnormalities: normal Conduction Disturbances: none Narrative Interpretation: unremarkable Minimal ST depressions seen in all leads. No acute ischemic changes noted     Radiology: *  CLINICAL DATA: Unresponsive  EXAM: CT HEAD WITHOUT CONTRAST  TECHNIQUE: Contiguous axial images were obtained from the base of the skull through the vertex without intravenous contrast.  COMPARISON: 11/23/2014  FINDINGS: Bony calvarium is intact. Atrophic and chronic white matter ischemic changes are seen. Somewhat rounded area of decreased attenuation is noted in the right parietal-occipital lobe best seen on image number 14 of series 2. This measures 16 mm and is consistent with a small focus of likely acute ischemia. No findings to suggest acute hemorrhage or space-occupying mass lesion are noted.  IMPRESSION: Chronic atrophic and ischemic changes.  Likely small area of acute ischemia in the right parieto-occipital lobe as described.   Electronically Signed By: Alcide Clever M.D. On: 2015-12-27 12:42      I personally reviewed the radiologic studies   Procedures:  Patient required airway management with his diminished Glasgow Coma Scale. The patient currently is biting down and seems to be an rigidity and I could not simply  orally intubated. Patient required paralytics. His level of consciousness appears to be diminished and I did not want to give him any medications that might make him hypotensive. Rapid sequence intubation Patient received 100 and milligrams of succinylcholine. He was continuously bag-valve-mask. First attempt was using a McIntosh blade with poor visualization of the vocal cords. Patient had a blind attempt and the esophagus was intubated on first pass. Tube was removed and the patient received oral tracheal intubation 7.5 endotracheal tube with a straight blade with easy visualization of vocal cords and positive change on the CO2 detector. There was a lot of heavy mucus lining the airway. Patient had verification with bilateral auscultation and chest x-ray evaluation. Tube was taped in place.   Critical Care:  CRITICAL CARE Performed by: Jennye Moccasin   Total critical care time: 45 minutes  Critical care time was exclusive of separately billable procedures and treating other patients.  Critical care was necessary to treat or prevent imminent or life-threatening deterioration.  Critical care was time spent personally by me on the following activities: development of treatment plan with patient and/or surrogate as well as nursing, discussions with consultants, evaluation of patient's response to treatment, examination of patient, obtaining history from patient or surrogate, ordering and performing treatments and interventions, ordering and review of laboratory studies, ordering and review of radiographic studies, pulse oximetry and re-evaluation of patient's condition. Initial evaluation and assessment for unresponsive patient with diminished Glasgow Coma Scale    ED Course:  Patient's airway was first secured and then he was established with a oral gastric tube along with Foley placement. The patient continued to be unresponsive during his stay here in emergency department. Later laboratory  work returned and elevated lactic acid level and with the patient having a low-grade fever rectally was initiated on sepsis-type treatment with 5 blood cultures 2, Zosyn, and vancomycin. Patient received IV fluid resuscitation and his blood pressure and heart rate seemed to remain stable here in emergency department did not require any pressors. Patient underwent head CT and initially a suspected diagnosis was a intracerebral bleed which he's had before in the past. No significant abnormalities were  cited on his head CT. Chest x-ray evaluation did not show a pneumonia with adequate tube placement. The patient later had numerous abnormalities on electrolytes and renal function which all would explain is gradual diminishing altered mental status. Patient was initiated on fluid resuscitation but may require dialysis based on the level of his creatinine etc. Patient's troponin is elevated does EKG does not show any ischemic changes at this time and this may be related to his poor renal function.    Assessment: Altered mental status Uremia Possible sepsis Multi system organ dysfunction   Final Clinical Impression:   Final diagnoses:  Glasgow coma scale total score 3-8, at arrival to emergency department [facility])     Plan:  Intensive care unit transfer, hospitalist evaluation            Jennye Moccasin, MD 12/25/15 1516

## 2015-12-02 ENCOUNTER — Inpatient Hospital Stay: Payer: Medicare Other

## 2015-12-02 LAB — COMPREHENSIVE METABOLIC PANEL
ALK PHOS: 61 U/L (ref 38–126)
ALT: 479 U/L — AB (ref 17–63)
AST: 381 U/L — ABNORMAL HIGH (ref 15–41)
Albumin: 2.4 g/dL — ABNORMAL LOW (ref 3.5–5.0)
Anion gap: 10 (ref 5–15)
BILIRUBIN TOTAL: 0.8 mg/dL (ref 0.3–1.2)
BUN: 153 mg/dL — AB (ref 6–20)
CALCIUM: 8.1 mg/dL — AB (ref 8.9–10.3)
CHLORIDE: 120 mmol/L — AB (ref 101–111)
CO2: 22 mmol/L (ref 22–32)
CREATININE: 6.46 mg/dL — AB (ref 0.61–1.24)
GFR calc non Af Amer: 7 mL/min — ABNORMAL LOW (ref >60)
GFR, EST AFRICAN AMERICAN: 8 mL/min — AB (ref >60)
GLUCOSE: 182 mg/dL — AB (ref 65–99)
Potassium: 3.4 mmol/L — ABNORMAL LOW (ref 3.5–5.1)
SODIUM: 152 mmol/L — AB (ref 135–145)
Total Protein: 5.5 g/dL — ABNORMAL LOW (ref 6.5–8.1)

## 2015-12-02 LAB — PROCALCITONIN: Procalcitonin: 4.3 ng/mL

## 2015-12-02 LAB — GLUCOSE, CAPILLARY: Glucose-Capillary: 205 mg/dL — ABNORMAL HIGH (ref 65–99)

## 2015-12-02 LAB — CBC
HEMATOCRIT: 33.5 % — AB (ref 40.0–52.0)
HEMOGLOBIN: 10.7 g/dL — AB (ref 13.0–18.0)
MCH: 30.3 pg (ref 26.0–34.0)
MCHC: 32.1 g/dL (ref 32.0–36.0)
MCV: 94.6 fL (ref 80.0–100.0)
Platelets: 58 10*3/uL — ABNORMAL LOW (ref 150–440)
RBC: 3.54 MIL/uL — AB (ref 4.40–5.90)
RDW: 14.5 % (ref 11.5–14.5)
WBC: 16.5 10*3/uL — ABNORMAL HIGH (ref 3.8–10.6)

## 2015-12-02 LAB — TROPONIN I: TROPONIN I: 0.27 ng/mL — AB (ref <0.031)

## 2015-12-02 LAB — SODIUM: SODIUM: 155 mmol/L — AB (ref 135–145)

## 2015-12-02 MED ORDER — MORPHINE SULFATE (PF) 2 MG/ML IV SOLN
2.0000 mg | INTRAVENOUS | Status: DC | PRN
  Administered 2015-12-02 – 2015-12-03 (×4): 2 mg via INTRAVENOUS
  Administered 2015-12-04: 4 mg via INTRAVENOUS
  Administered 2015-12-05 – 2015-12-06 (×4): 2 mg via INTRAVENOUS
  Filled 2015-12-02: qty 1
  Filled 2015-12-02: qty 2
  Filled 2015-12-02 (×7): qty 1

## 2015-12-02 MED ORDER — ASPIRIN 81 MG PO CHEW
324.0000 mg | CHEWABLE_TABLET | Freq: Every day | ORAL | Status: DC
  Administered 2015-12-02: 324 mg
  Filled 2015-12-02: qty 4

## 2015-12-02 NOTE — Progress Notes (Signed)
[facility], family with pt during transfer. No concerns at this time.

## 2015-12-02 NOTE — Progress Notes (Signed)
Pharmacy ICU Daily Progress Note  Jacob Clark is an 82yo male admitted 11/30/2015 with a known history of HTN, CVA and hemorrhagic CVA who [facility] with unresponsiveness and hypotension. CT Head shows small non hemmorhagic CVA.  Pt on valproic acid for seizure history, level on arrival <10.  Active pharmacy consults: none, patient is comfort care  Infectious:  Antimicrobials:  Vancomycin 3/16>> Zosyn 3/16>> WBC 16.5 Afebrile now, 100.1 on admit Procalcitonin/LDH: PCT 1.59 Culture Results: MRSA PCR (+) 3/16 BCx x2 NGTD Resp culture ordered, not collected  Electrolytes:  Potassium:  Magnesium:  Phosphorus:  Supplementation plans:   Pulmonary: Bronchodilators? none Ventilator status: yes O2 sat/FiO2: 100 pn vent, 40%  Current steroids: none Taper plans:   GI:  Constipation PPx: none Feeding status: NPO LBM: 3/17 SUP: pantoprazole 40mg  IV daily  Insulin:  SSI use in 24hrs: none Current Insulin orders: none Last 3 CBGs: 114  DVT PPx: heparin 5000 units SQ q12h, plts 58  Sedation+Pain: Versed and fentanyl PRN RASS goal? -1 GCS 4 Last pain score: 0 Opioid use in last 24 hrs:   Pressors: none (BP has still been low) MAP goal:   Medication education/counseling required?

## 2015-12-02 NOTE — Progress Notes (Signed)
Family at bedside have discussed with Dr Bard HerbertSimmonds regarding code status and goals, pt is now comfort care per discussion with family. No ss of pain noted.

## 2015-12-02 NOTE — Progress Notes (Signed)
Pt SBP in 70's MAP >60. HR 50's-60's. Pt remains minimally responsive and only withdraws from painful stimuli. eLink updated. Awaiting any new orders. Will continue to monitor

## 2015-12-02 NOTE — Progress Notes (Signed)
Nutrition Brief Note  Chart reviewed. Pt triggered for assessment due to vent status Pt now transitioning to comfort care, s/p extubation, NPO No further nutrition interventions warranted at this time.  Please re-consult as needed.   Romelle Starcherate Cyndal Kasson MS, RD, LDN [phone] Pager  [phone] Weekend/On-Call Pager

## 2015-12-02 NOTE — Progress Notes (Signed)
[facility] [place] [facility]   PATIENT NAME: Jacob SkeetersFrederick Clark    MR#:  [id number]  DATE OF BIRTH:  [birth year]  SUBJECTIVE:  CHIEF COMPLAINT:   Chief Complaint  Patient presents with  . Respiratory Distress  . Altered Mental Status   - Admitted with AMS,known h/o advanced dementia and accelerated functional decline. - Intubated for airway protection, hypernatremia - per Intensivist notes- plan for terminal extubation if no improvement- patient is not on sedation- withdrawing to pain -Blood pressure is borderline low responding to fluids.. No pressors per family    REVIEW OF SYSTEMS:  Review of Systems  Unable to perform ROS: critical illness    DRUG ALLERGIES:   Allergies  Allergen Reactions  . Alprazolam Nausea And Vomiting  . Memantine Hcl Nausea And Vomiting and Swelling  . Memantine Rash    Other reaction(s): SWELLING/EDEMA    VITALS:  Blood pressure 72/52, pulse 47, temperature 98.9 F (37.2 C), temperature source Axillary, resp. rate 20, height 6\' 1"  (1.854 m), weight 75.8 kg (167 lb 1.7 oz), SpO2 100 %.  PHYSICAL EXAMINATION:  Physical Exam  GENERAL:  82 y.o.-year-old patient lying in the bed critically ill appearing.  EYES: Pupils equal, round, not reactive to light and accommodation. No scleral icterus.  HEENT: Head atraumatic, normocephalic. Oropharynx and nasopharynx clear.  NECK:  Supple, no jugular venous distention. No thyroid enlargement, no tenderness.  LUNGS: Normal breath sounds bilaterally, decreased bibasilar breath sounds. no wheezing, rales,rhonchi or crepitation. No use of accessory muscles of respiration.  CARDIOVASCULAR: S1, S2 normal. No  rubs, or gallops. 3/6 systolic murmur is present ABDOMEN: Soft, nontender, nondistended. Bowel sounds present. No organomegaly or mass.  EXTREMITIES: No pedal edema, cyanosis, or clubbing.  NEUROLOGIC: Patient is encephalopathic not responding. Withdrawing to pain. GCS of  5-6 PSYCHIATRIC: The patient is lethargic.  SKIN: No obvious rash, lesion, or ulcer.    LABORATORY PANEL:   CBC  Recent Labs Lab 12-27-2015 1104  WBC 14.4*  HGB 13.4  HCT 42.0  PLT 87*   ------------------------------------------------------------------------------------------------------------------  Chemistries   Recent Labs Lab 12-27-2015 1104  12/02/15 0241  NA 161*  < > 155*  K 5.6*  --   --   CL 126*  --   --   CO2 23  --   --   GLUCOSE 114*  --   --   BUN 115*  --   --   CREATININE 5.78*  --   --   CALCIUM 8.8*  --   --   AST 934*  --   --   ALT 656*  --   --   ALKPHOS 77  --   --   BILITOT 1.1  --   --   < > = values in this interval not displayed. ------------------------------------------------------------------------------------------------------------------  Cardiac Enzymes  Recent Labs Lab 12/02/15 0241  TROPONINI 0.27*   ------------------------------------------------------------------------------------------------------------------  RADIOLOGY:  Ct Head Wo Contrast  2015-12-20  CLINICAL DATA:  Unresponsive EXAM: CT HEAD WITHOUT CONTRAST TECHNIQUE: Contiguous axial images were obtained from the base of the skull through the vertex without intravenous contrast. COMPARISON:  11/23/2014 FINDINGS: Bony calvarium is intact. Atrophic and chronic white matter ischemic changes are seen. Somewhat rounded area of decreased attenuation is noted in the right parietal-occipital lobe best seen on image number 14 of series 2. This measures 16 mm and is consistent with a small focus of likely acute ischemia. No findings to suggest acute hemorrhage or space-occupying mass lesion  are noted. IMPRESSION: Chronic atrophic and ischemic changes. Likely small area of acute ischemia in the right parieto-occipital lobe as described. Electronically Signed   By: Alcide Clever M.D.   On: 12-30-2015 12:42   Portable Chest Xray  12/02/2015  CLINICAL DATA:  Acute onset of respiratory  failure. Initial encounter. EXAM: PORTABLE CHEST 1 VIEW COMPARISON:  Chest radiograph performed 2015-12-30 FINDINGS: The lungs are well-aerated. Mild focal right basilar airspace opacity raises concern for pneumonia. There is no evidence of pleural effusion or pneumothorax. The cardiomediastinal silhouette is within normal limits. No acute osseous abnormalities are seen. IMPRESSION: Mild focal right basilar airspace opacity raises concern for pneumonia. Electronically Signed   By: Roanna Raider M.D.   On: 12/02/2015 03:15   Dg Chest Port 1 View  12-30-2015  CLINICAL DATA:  Intubated. EXAM: PORTABLE CHEST 1 VIEW COMPARISON:  None. FINDINGS: The endotracheal to is 3.8 cm above the carina. The NG tube is coursing down the esophagus and into the stomach. The cardiac silhouette, mediastinal and hilar contours are within normal limits for age. There is moderate tortuosity and calcification of the thoracic aorta. The pulmonary hila appear normal. Moderate eventration of the right hemidiaphragm with some overlying vascular crowding and atelectasis. No edema, infiltrates, pleural effusion or pneumothorax. The bony thorax is intact. IMPRESSION: The endotracheal tube and NG tubes are in good position. No acute pulmonary findings. Electronically Signed   By: Rudie Meyer M.D.   On: 2015/12/30 11:51    EKG:   Orders placed or performed during the hospital encounter of 12/30/2015  . EKG 12-Lead  . EKG 12-Lead    ASSESSMENT AND PLAN:   82 year old male with past medical history significant for CVA, dementia advanced, functional decline recently, hypertension and history of scrotal mass presented to Hospital from [facility] nursing home secondary to hypertension hypoxia and altered mental status.  #1 acute respiratory failure- unable to protect airway due to change in mental status. -Intubated and on vent. No aggressive measures, no pressors per family. -Family to discuss with the intensivist this morning  about further goals of care. Possible extubation today. -Management per pulmonology team  #2 Acute CVA-known history of prior CVA, bedridden status. CT of the head showing acute right parieto-occipital infarct. -On rectal aspirin. No aggressive measures per family.  #3 Hypernatremia-poor oral intake. Hypovolemic hypernatremia. -On D5 water. Follow-up labs this morning  #4 Sepsis- chest x-ray with mild pneumonia. Cultures are pending. -Continue Vanco and Zosyn for now until a final decision is made.  #5 Advanced dementia- poor prognosis  #6 ARF- ATN and sepsis - Family refused dialysis F/u BMP  #7 Elevated troponin- demand ischemia  #8 DVT prophylaxis-on subcutaneous heparin   All the records are reviewed and case discussed with Care Management/Social Workerr. Management plans discussed with the patient, family and they are in agreement.  CODE STATUS: DO NOT RESUSCITATE  TOTAL  TIME TAKING CARE OF THIS PATIENT: 38 minutes.   POSSIBLE D/C IN 2 DAYS, DEPENDING ON CLINICAL CONDITION.   Enid Baas M.D on 12/02/2015 at 10:18 AM  Between 7am to 6pm - Pager - [phone]  After 6pm go to [email or web address] - password [email or web address] [facility]  [facility]  Office  [phone]  CC: Primary care physician; Jerl Mina, MD

## 2015-12-02 NOTE — Progress Notes (Signed)
Patient extubated per comfort care orders, family at bedside, pt on o2 [place]. No ss of discomfort noted.

## 2015-12-02 NOTE — Progress Notes (Signed)
Remains comatose and hypotensive Na slightly improved but Cr worse and Uo poor. CXR reveals RLL AS dz c/w PNA I updated the wife and daughter and they understand his very poor prognosis.  They wish to proceed with extubation and comfort care "withdrawal..." protocol implemented   Billy Fischeravid Simonds, MD PCCM service Mobile [phone] Pager [phone]

## 2015-12-03 LAB — CULTURE, RESPIRATORY

## 2015-12-03 LAB — CULTURE, RESPIRATORY W GRAM STAIN

## 2015-12-03 NOTE — Progress Notes (Signed)
[facility] - [place] at [facility]   PATIENT NAME: Jacob SkeetersFrederick Thom    MR#:  [id number]  DATE OF BIRTH:  October 11, 1929  SUBJECTIVE:  CHIEF COMPLAINT:   Chief Complaint  Patient presents with  . Respiratory Distress  . Altered Mental Status   - Admitted with AMS,known h/o advanced dementia and accelerated functional decline. - patient is now on comfort care, family at bedside. Appears comfortably.   REVIEW OF SYSTEMS:  Review of Systems  Unable to perform ROS: critical illness    DRUG ALLERGIES:   Allergies  Allergen Reactions  . Alprazolam Nausea And Vomiting  . Memantine Hcl Nausea And Vomiting and Swelling  . Memantine Rash    Other reaction(s): SWELLING/EDEMA    VITALS:  Blood pressure 106/50, pulse 68, temperature 100.3 F (37.9 C), temperature source Oral, resp. rate 24, height 6\' 1"  (1.854 m), weight 75.8 kg (167 lb 1.7 oz), SpO2 90 %.  PHYSICAL EXAMINATION:  Physical Exam  GENERAL:  82 y.o.-year-old patient lying in the bed. Appears comfortable.  EYES: Pupils equal, round, not reactive to light and accommodation. No scleral icterus.  HEENT: Head atraumatic, normocephalic. Oropharynx and nasopharynx clear.  NECK:  Supple, no jugular venous distention. No thyroid enlargement, no tenderness.  LUNGS: Normal breath sounds bilaterally, decreased bibasilar breath sounds. no wheezing, rales,rhonchi or crepitation. No use of accessory muscles of respiration.  CARDIOVASCULAR: S1, S2 normal. No  rubs, or gallops. 3/6 systolic murmur is present ABDOMEN: Soft, nontender, nondistended. Bowel sounds present. No organomegaly or mass.  EXTREMITIES: No pedal edema, cyanosis, or clubbing.  NEUROLOGIC: Patient is lethargic PSYCHIATRIC: The patient is lethargic.  SKIN: No obvious rash, lesion, or ulcer.    LABORATORY PANEL:   CBC  Recent Labs Lab 12/02/15 0901  WBC 16.5*  HGB 10.7*  HCT 33.5*  PLT 58*    ------------------------------------------------------------------------------------------------------------------  Chemistries   Recent Labs Lab 12/02/15 0901  NA 152*  K 3.4*  CL 120*  CO2 22  GLUCOSE 182*  BUN 153*  CREATININE 6.46*  CALCIUM 8.1*  AST 381*  ALT 479*  ALKPHOS 61  BILITOT 0.8   ------------------------------------------------------------------------------------------------------------------  Cardiac Enzymes  Recent Labs Lab 12/02/15 0241  TROPONINI 0.27*   ------------------------------------------------------------------------------------------------------------------  RADIOLOGY:  Ct Head Wo Contrast  12/04/2015  CLINICAL DATA:  Unresponsive EXAM: CT HEAD WITHOUT CONTRAST TECHNIQUE: Contiguous axial images were obtained from the base of the skull through the vertex without intravenous contrast. COMPARISON:  11/23/2014 FINDINGS: Bony calvarium is intact. Atrophic and chronic white matter ischemic changes are seen. Somewhat rounded area of decreased attenuation is noted in the right parietal-occipital lobe best seen on image number 14 of series 2. This measures 16 mm and is consistent with a small focus of likely acute ischemia. No findings to suggest acute hemorrhage or space-occupying mass lesion are noted. IMPRESSION: Chronic atrophic and ischemic changes. Likely small area of acute ischemia in the right parieto-occipital lobe as described. Electronically Signed   By: Alcide CleverMark  Lukens M.D.   On: 11/19/2015 12:42   Portable Chest Xray  12/02/2015  CLINICAL DATA:  Acute onset of respiratory failure. Initial encounter. EXAM: PORTABLE CHEST 1 VIEW COMPARISON:  Chest radiograph performed 11/29/2015 FINDINGS: The lungs are well-aerated. Mild focal right basilar airspace opacity raises concern for pneumonia. There is no evidence of pleural effusion or pneumothorax. The cardiomediastinal silhouette is within normal limits. No acute osseous abnormalities are seen.  IMPRESSION: Mild focal right basilar airspace opacity raises concern for pneumonia. Electronically Signed  By: Roanna Raider M.D.   On: 12/02/2015 03:15    EKG:   Orders placed or performed during the hospital encounter of 12/13/2015  . EKG 12-Lead  . EKG 12-Lead    ASSESSMENT AND PLAN:   82 year old male with past medical history significant for CVA, dementia advanced, functional decline recently, hypertension and history of scrotal mass presented to Hospital from [facility] home secondary to hypertension hypoxia and altered mental status.  Initially admitted to ICU, intubated, but due to rapid decline and no improvement in his labs and clinical condition- family has chosen comfort care. Currently appears comfortable, family at bedside.  He has the following diagnosis now:   # Acute respiratory failure # Pneumonia # Acute CVA-CT of the head showing acute right parieto-occipital infarct. # Hypernatremia # Sepsis # ARF # Metabolic encephalopathy # Advanced dementia # Elevated troponin  Vitals stable this morning.  All the records are reviewed and case discussed with Care Management/Social Workerr. Management plans discussed with the patient, family and they are in agreement.  CODE STATUS: DO NOT RESUSCITATE  TOTAL  TIME TAKING CARE OF THIS PATIENT: 22 minutes.  Marland Kitchen   Enid Baas M.D on 12/03/2015 at 11:24 AM  Between 7am to 6pm - Pager - [phone]  After 6pm go to [email or web address] - password [email or web address] [facility]  [place] [facility]  Office  [phone]  CC: Primary care physician; Jerl Mina, MD

## 2015-12-04 NOTE — Clinical Social Work Note (Addendum)
Clinical Social Work Assessment  Patient Details  Name: Jacob Clark MRN: [id number] Date of Birth: 01/08/1930  Date of referral:  12/04/15               Reason for consult:  End of Life/Hospice (Comfort Care)                Permission sought to share information with:    Permission granted to share information::     Name::        Agency::     Relationship::     Contact Information:     Housing/Transportation Living arrangements for the past 2 months:  [place] of Information:  Spouse Patient Interpreter Needed:  None Criminal Activity/Legal Involvement Pertinent to Current Situation/Hospitalization:  No - Comment as needed Significant Relationships:  Adult Children, Spouse Lives with:  Facility Resident Do you feel safe going back to the place where you live?    Need for family participation in patient care:  Yes (Comment)  Care giving concerns: Reason for consult: Comfort Care  Social Worker assessment / plan:  Clinical Social Worker (CSW) met with patient at bedside. Patient is unresponsive while lying in bed sleeping. Patient has advanced dementia with altered Mental Status. Daughter Jacob Clark and spouse Jacob Clark at bedside. . CSW introduced self and explained role of CSW department. According to patient spouse, patient was a resident at [facility] prior to hospital admission. Wife does not want patient to return to facility. Wife reports patient suffered a fall in December back at home and things went downhill from there. Patient now on comfort care. Family refused hospice home at this time. Per spouse " I would like to keep him here until he pass, It'll be too much on him if we move him anywhere".   CSW will continue to follow and assist as needed.   Employment status:  Retired Nurse, adult PT Recommendations:    Information / Referral to community resources:     Patient/Family's Response to care:  Patient  and daughter has refused hospice home at this time. Family refers for patient to remain in hospital.  Patient/Family's Understanding of and Emotional Response to Diagnosis, Current Treatment, and Prognosis:  Patient spouse Jacob Clark and daughter Jacob Clark were pleasant throughout assessment and thanked CSW for visit.  Emotional Assessment Appearance:  Appears older than stated age, Well-Groomed Attitude/Demeanor/Rapport:  Unresponsive Affect (typically observed):  Calm, Stable, Appropriate Orientation:   (Patient is not alert) Alcohol / Substance use:    Psych involvement (Current and /or in the community):     Discharge Needs  Concerns to be addressed:  No discharge needs identified Readmission within the last 30 days:    Current discharge risk:    Barriers to Discharge:  Continued Medical Work up   [facility]	[place], LCSW 12/04/2015, 3:26 PM

## 2015-12-04 NOTE — Progress Notes (Signed)
[facility] - [place] at [facility]   PATIENT NAME: Jacob Clark    MR#:  [id number]  DATE OF BIRTH:  07-Jul-1930  SUBJECTIVE:  CHIEF COMPLAINT:   Chief Complaint  Patient presents with  . Respiratory Distress  . Altered Mental Status   - Admitted with AMS,known h/o advanced dementia and accelerated functional decline. - patient is now on comfort care. Vitals stable this morning.  - family refused hospice home at this time. If stable by tomorrow- they can consider home with hospice. However patient looking critical.   REVIEW OF SYSTEMS:  Review of Systems  Unable to perform ROS: critical illness    DRUG ALLERGIES:   Allergies  Allergen Reactions  . Alprazolam Nausea And Vomiting  . Memantine Hcl Nausea And Vomiting and Swelling  . Memantine Rash    Other reaction(s): SWELLING/EDEMA    VITALS:  Blood pressure 96/67, pulse 78, temperature 100.7 F (38.2 C), temperature source Oral, resp. rate 34, height  (1.854 m), weight 75.8 kg (167 lb 1.7 oz), SpO2 92 %.  PHYSICAL EXAMINATION:  Physical Exam  GENERAL:  82 y.o.-year-old patient lying in the bed. Appears comfortable.  EYES: Pupils equal, round, not reactive to light and accommodation. No scleral icterus.  HEENT: Head atraumatic, normocephalic. Oropharynx and nasopharynx clear.  NECK:  Supple, no jugular venous distention. No thyroid enlargement, no tenderness.  LUNGS: some tachypnea noted now. Normal breath sounds bilaterally, decreased bibasilar breath sounds. no wheezing, rales,rhonchi or crepitation. No use of accessory muscles of respiration.  CARDIOVASCULAR: S1, S2 normal. No  rubs, or gallops. 3/6 systolic murmur is present ABDOMEN: Soft, nontender, nondistended. Bowel sounds present. No organomegaly or mass.  EXTREMITIES: No pedal edema, cyanosis, or clubbing.  NEUROLOGIC: Patient is lethargic PSYCHIATRIC: The patient is lethargic.  SKIN: No obvious rash, lesion, or ulcer.     LABORATORY PANEL:   CBC  Recent Labs Lab 12/02/15 0901  WBC 16.5*  HGB 10.7*  HCT 33.5*  PLT 58*   ------------------------------------------------------------------------------------------------------------------  Chemistries   Recent Labs Lab 12/02/15 0901  NA 152*  K 3.4*  CL 120*  CO2 22  GLUCOSE 182*  BUN 153*  CREATININE 6.46*  CALCIUM 8.1*  AST 381*  ALT 479*  ALKPHOS 61  BILITOT 0.8   ------------------------------------------------------------------------------------------------------------------  Cardiac Enzymes  Recent Labs Lab 12/02/15 0241  TROPONINI 0.27*   ------------------------------------------------------------------------------------------------------------------  RADIOLOGY:  No results found.  EKG:   Orders placed or performed during the hospital encounter of 11/30/2015  . EKG 12-Lead  . EKG 12-Lead    ASSESSMENT AND PLAN:   82 year old male with past medical history significant for CVA, dementia advanced, functional decline recently, hypertension and history of scrotal mass presented to Hospital from [facility] nursing home secondary to hypertension hypoxia and altered mental status.  Initially admitted to ICU, intubated, but due to rapid decline and no improvement in his labs and clinical condition- family has chosen comfort care. Currently appears comfortable, family at bedside.  He has the following diagnosis now:   # Acute respiratory failure # Pneumonia # Acute CVA-CT of the head showing acute right parieto-occipital infarct. # Hypernatremia # Sepsis # ARF # Metabolic encephalopathy # Advanced dementia # Elevated troponin  Vitals stable this morning. Family refused hospice home for now. Continue to monitor and use morphine as needed  All the records are reviewed and case discussed with Care Management/Social Workerr. Management plans discussed with the patient, family and they are in agreement.  CODE  STATUS: DO NOT RESUSCITATE  TOTAL  TIME TAKING CARE OF THIS PATIENT: 80 minutes.  Marland Kitchen.   Enid BaasKALISETTI,Jacob Bogie M.D on 12/04/2015 at 10:02 AM  Between 7am to 6pm - Pager - [phone]  After 6pm go to [email or web address] - password [email or web address] [facility]  [place] [place] [facility]  Office  [phone]  CC: Primary care physician; Jerl MinaHEDRICK, JAMES, MD

## 2015-12-05 NOTE — Care Management Important Message (Signed)
Important Message  Patient Details  Name: Jacob Clark MRN: [id number] Date of Birth: 07-18-1930   Medicare Important Message Given:  Yes    Gwenette GreetBrenda S Meena Barrantes, RN 12/05/2015, 12:01 PM

## 2015-12-05 NOTE — Care Management (Signed)
A resident of [facility]. Comfort measures continue. Family is at the bedside. Unresponsive. Temperature =100.7 WBC's 13.5 Gwenette GreetBrenda S Doreena Maulden RN MSN CCM Care Management [phone]

## 2015-12-05 NOTE — Progress Notes (Signed)
[facility] - [place] [facility]   PATIENT NAME: Jacob SkeetersFrederick Clark    MR#:  [id number]  DATE OF BIRTH:  09-12-1930  SUBJECTIVE:on comfort care,  CHIEF COMPLAINT:   Chief Complaint  Patient presents with  . Respiratory Distress  . Altered Mental Status   - Admitted with AMS,known h/o advanced dementia and accelerated functional decline. - patient is now on comfort care. Vitals stable this morning.  -   REVIEW OF SYSTEMS:  Review of Systems  Unable to perform ROS: critical illness    DRUG ALLERGIES:   Allergies  Allergen Reactions  . Alprazolam Nausea And Vomiting  . Memantine Hcl Nausea And Vomiting and Swelling  . Memantine Rash    Other reaction(s): SWELLING/EDEMA    VITALS:  Blood pressure 84/64, pulse 55, temperature 98.7 F (37.1 C), temperature source Oral, resp. rate 24, height 6\' 1"  (1.854 m), weight 75.8 kg (167 lb 1.7 oz), SpO2 92 %.  PHYSICAL EXAMINATION:  Physical Exam  GENERAL:  82 y.o.-year-old patient lying in the bed. Appears comfortable.  EYES: Pupils equal, round, not reactive to light and accommodation. No scleral icterus.  HEENT: Head atraumatic, normocephalic. Oropharynx and nasopharynx clear.  NECK:  Supple, no jugular venous distention. No thyroid enlargement, no tenderness.  LUNGS: some tachypnea noted now. Normal breath sounds bilaterally, decreased bibasilar breath sounds. no wheezing, rales,rhonchi or crepitation. No use of accessory muscles of respiration.  CARDIOVASCULAR: S1, S2 normal. No  rubs, or gallops. 3/6 systolic murmur is present ABDOMEN: Soft, nontender, nondistended. Bowel sounds present. No organomegaly or mass.  EXTREMITIES: No pedal edema, cyanosis, or clubbing.  NEUROLOGIC: Patient is lethargic PSYCHIATRIC: The patient is lethargic.  SKIN: No obvious rash, lesion, or ulcer.    LABORATORY PANEL:   CBC  Recent Labs Lab 12/02/15 0901  WBC 16.5*  HGB 10.7*  HCT 33.5*  PLT 58*    ------------------------------------------------------------------------------------------------------------------  Chemistries   Recent Labs Lab 12/02/15 0901  NA 152*  K 3.4*  CL 120*  CO2 22  GLUCOSE 182*  BUN 153*  CREATININE 6.46*  CALCIUM 8.1*  AST 381*  ALT 479*  ALKPHOS 61  BILITOT 0.8   ------------------------------------------------------------------------------------------------------------------  Cardiac Enzymes  Recent Labs Lab 12/02/15 0241  TROPONINI 0.27*   ------------------------------------------------------------------------------------------------------------------  RADIOLOGY:  No results found.  EKG:   Orders placed or performed during the hospital encounter of 12/13/2015  . EKG 12-Lead  . EKG 12-Lead    ASSESSMENT AND PLAN:   82 year old male with past medical history significant for CVA, dementia advanced, functional decline recently, hypertension and history of scrotal mass presented to Hospital from [facility] secondary to hypertension hypoxia and altered mental status.  Initially admitted to ICU, intubated, but due to rapid decline and no improvement in his labs and clinical condition- family has chosen comfort care. Currently appears comfortable, wife is at bedside,  He has the following diagnosis now:  # Acute respiratory failure # Pneumonia # Acute CVA-CT of the head showing acute right parieto-occipital infarct. # Hypernatremia # Sepsis # ARF # Metabolic encephalopathy # Advanced dementia # Elevated troponin  Hypotensive today.continue comfort care D/w wife  All the records are reviewed and case discussed with Care Management/Social Workerr. Management plans discussed with the patient, family and they are in agreement.  CODE STATUS: DO NOT RESUSCITATE  TOTAL  TIME TAKING CARE OF THIS PATIENT: 22 minutes.  Marland Kitchen.   Katha HammingKONIDENA,Kimball Appleby M.D on 12/05/2015 at 1:25 PM  Between 7am to 6pm -  Pager -  [phone]  After 6pm go to [email or web address] - password [email or web address] [facility]  [place] [facility]  Office  [phone]  CC: Primary care physician; Jerl Mina, MD

## 2015-12-05 NOTE — Progress Notes (Signed)
   12/05/15 1000  Clinical Encounter Type  Visited With Patient and family together  Visit Type Initial  Referral From Nurse  Consult/Referral To Chaplain  Spiritual Encounters  Spiritual Needs Emotional;Grief support  Stress Factors  Patient Stress Factors Health changes  Family Stress Factors Major life changes  Met w/patient & family. Patient remained unconscious, but appeared comfortable. Son-in-law advised of pt.'s vitals and apparent changes through the night (shallow breathing). Spouse and I conducted a brief life review of pt. Provided prayer at family's request. Chap. Jeronimo Hellberg G. Calcium

## 2015-12-05 NOTE — Plan of Care (Signed)
Problem: Education: Goal: Knowledge of  General Education information/materials will improve Outcome: Not Progressing Actively dying

## 2015-12-06 DIAGNOSIS — N178 Other acute kidney failure: Secondary | ICD-10-CM

## 2015-12-06 DIAGNOSIS — Z515 Encounter for palliative care: Secondary | ICD-10-CM

## 2015-12-06 DIAGNOSIS — J9601 Acute respiratory failure with hypoxia: Secondary | ICD-10-CM

## 2015-12-06 DIAGNOSIS — F0390 Unspecified dementia without behavioral disturbance: Secondary | ICD-10-CM

## 2015-12-06 DIAGNOSIS — Z8673 Personal history of transient ischemic attack (TIA), and cerebral infarction without residual deficits: Secondary | ICD-10-CM

## 2015-12-06 DIAGNOSIS — E875 Hyperkalemia: Secondary | ICD-10-CM

## 2015-12-06 DIAGNOSIS — N509 Disorder of male genital organs, unspecified: Secondary | ICD-10-CM

## 2015-12-06 DIAGNOSIS — I638 Other cerebral infarction: Secondary | ICD-10-CM

## 2015-12-06 DIAGNOSIS — I10 Essential (primary) hypertension: Secondary | ICD-10-CM

## 2015-12-06 DIAGNOSIS — J9690 Respiratory failure, unspecified, unspecified whether with hypoxia or hypercapnia: Secondary | ICD-10-CM | POA: Insufficient documentation

## 2015-12-06 DIAGNOSIS — E559 Vitamin D deficiency, unspecified: Secondary | ICD-10-CM

## 2015-12-06 DIAGNOSIS — R6521 Severe sepsis with septic shock: Secondary | ICD-10-CM

## 2015-12-06 DIAGNOSIS — E785 Hyperlipidemia, unspecified: Secondary | ICD-10-CM

## 2015-12-06 LAB — CULTURE, BLOOD (ROUTINE X 2)
CULTURE: NO GROWTH
Culture: NO GROWTH

## 2015-12-06 MED ORDER — GLYCOPYRROLATE 0.2 MG/ML IJ SOLN: 0.1000 mg | Freq: Four times a day (QID) | INTRAMUSCULAR | Status: DC | PRN

## 2015-12-06 MED ORDER — MORPHINE SULFATE (CONCENTRATE) 10 MG/0.5ML PO SOLN
5.0000 mg | ORAL | Status: DC | PRN
  Administered 2015-12-06: 5 mg via ORAL
  Filled 2015-12-06: qty 1

## 2015-12-06 MED ORDER — DIAZEPAM 5 MG/ML IJ SOLN: 2.5000 mg | Freq: Four times a day (QID) | INTRAMUSCULAR | Status: DC | PRN

## 2015-12-06 MED ORDER — MORPHINE SULFATE 25 MG/ML IV SOLN
2.0000 mg/h | INTRAVENOUS | Status: DC
  Administered 2015-12-06: 13:00:00 2 mg/h via INTRAVENOUS
  Filled 2015-12-06: qty 10

## 2015-12-06 MED ORDER — BISACODYL 10 MG RE SUPP: 10.0000 mg | Freq: Every day | RECTAL | Status: DC | PRN

## 2015-12-06 NOTE — Progress Notes (Signed)
Dr. Orvan Falconerampbell made aware that pt's respirations are 34/min with current morphine drip at 2mg /hr and prn dose of morphine oral solution given.  New order receive to increase morphine drip as need up to 510ml/her.  Rate increased to 4mg /hr.  Will continue to monitor. Family at bedside and in agreement.  Orson Apeanielle Annaleise Burger, RN

## 2015-12-06 NOTE — Progress Notes (Signed)
[facility] - [place] at [facility]   PATIENT NAME: Jacob SkeetersFrederick Clark    MR#:  [id number]  DATE OF BIRTH:  1930-04-25  SUBJECTIVE:on comfort care, started on Morphine drip .  CHIEF COMPLAINT:   Chief Complaint  Patient presents with  . Respiratory Distress  . Altered Mental Status   - Admitted with AMS,known h/o advanced dementia and accelerated functional decline. - patient is now on comfort care. Vitals stable this morning.  -   REVIEW OF SYSTEMS:  Review of Systems  Unable to perform ROS: critical illness    DRUG ALLERGIES:   Allergies  Allergen Reactions  . Alprazolam Nausea And Vomiting  . Memantine Hcl Nausea And Vomiting and Swelling  . Memantine Rash    Other reaction(s): SWELLING/EDEMA    VITALS:  Blood pressure 96/70, pulse 32, temperature 98.9 F (37.2 C), temperature source Oral, resp. rate 42, height 6\' 1"  (1.854 m), weight 75.8 kg (167 lb 1.7 oz), SpO2 92 %.  PHYSICAL EXAMINATION:  Physical Exam  GENERAL:  82 y.o.-year-old patient lying in the bed. Appears comfortable.  EYES: Pupils equal, round, not reactive to light and accommodation. No scleral icterus.  HEENT: Head atraumatic, normocephalic. Oropharynx and nasopharynx clear.  NECK:  Supple, no jugular venous distention. No thyroid enlargement, no tenderness.  LUNGS: some tachypnea noted now. Normal breath sounds bilaterally, decreased bibasilar breath sounds. no wheezing, rales,rhonchi or crepitation. No use of accessory muscles of respiration.  CARDIOVASCULAR: S1, S2 normal. No  rubs, or gallops. 3/6 systolic murmur is present ABDOMEN: Soft, nontender, nondistended. Bowel sounds present. No organomegaly or mass.  EXTREMITIES: No pedal edema, cyanosis, or clubbing.  NEUROLOGIC: Patient is lethargic PSYCHIATRIC: The patient is lethargic.  SKIN: No obvious rash, lesion, or ulcer.    LABORATORY PANEL:   CBC  Recent Labs Lab 12/02/15 0901  WBC 16.5*  HGB 10.7*  HCT  33.5*  PLT 58*   ------------------------------------------------------------------------------------------------------------------  Chemistries   Recent Labs Lab 12/02/15 0901  NA 152*  K 3.4*  CL 120*  CO2 22  GLUCOSE 182*  BUN 153*  CREATININE 6.46*  CALCIUM 8.1*  AST 381*  ALT 479*  ALKPHOS 61  BILITOT 0.8   ------------------------------------------------------------------------------------------------------------------  Cardiac Enzymes  Recent Labs Lab 12/02/15 0241  TROPONINI 0.27*   ------------------------------------------------------------------------------------------------------------------  RADIOLOGY:  No results found.  EKG:   Orders placed or performed during the hospital encounter of 11/19/2015  . EKG 12-Lead  . EKG 12-Lead    ASSESSMENT AND PLAN:   82 year old male with past medical history significant for CVA, dementia advanced, functional decline recently, hypertension and history of scrotal mass presented to [facility] from [facility] secondary to hypertension hypoxia and altered mental status.  Initially admitted to ICU, intubated, but due to rapid decline and no improvement in his labs and clinical condition- family has chosen comfort care. Currently appears comfortable, wife is at bedside,started on Morphine drip by palliative care team.  He has the following diagnosis now:  # Acute respiratory failure # Pneumonia # Acute CVA-CT of the head showing acute right parieto-occipital infarct. # Hypernatremia # Sepsis # ARF # Metabolic encephalopathy # Advanced dementia # Elevated troponin  Continue Morphine drip,ativan,.wife wants pt to stay here instead of hospice home Appreciate Palliative care input. D/w wife   All the records are reviewed and case discussed with Care Management/Social Workerr. Management plans discussed with the patient, family and they are in agreement.  CODE STATUS: DO NOT  RESUSCITATE  TOTAL  TIME TAKING CARE OF THIS PATIENT: 22 minutes.  Marland Kitchen   Katha Hamming M.D on 12/04/2015 at 1:36 PM  Between 7am to 6pm - Pager - [phone]  After 6pm go to [email or web address] - password [email or web address] [facility]  [place] [facility]  Office  [phone]  CC: Primary care physician; Jerl Mina, MD

## 2015-12-06 NOTE — Consult Note (Signed)
Palliative Medicine Inpatient Consult Note   Name: Jacob Clark Date: 2015-12-10 MRN: [id number]  DOB: Dec 23, 1929  Referring Physician: Katha Hamming, MD  Palliative Care consult requested for this 82 y.o. male for goals of medical therapy in patient with acute respiratory failure.     TODAY'S DISCUSSIONS AND DECISIONS:  1. Pts wife is not receptive to pt going anywhere. She says this hospital is close to her home and Hospice Home is further away. She feels he is getting good comfort care and attention from the nurses and that hospice home does not offfer any advantages over him dying peacefully here.  2.  AFTER I explained why we use morphine in a very basic manner (describing how air hunger happens and what is going on in the brain when this happens) she agreed to pt having more morphine.  She now has a better understanding that our goals of having his respirations UNDER 30 are for a reason --and that is to make certain that his brain is not panicking on the inside --and thus sensing being smothered.  She will permit a low rate of morphine drip (which the nurse feels would be best as well).  I will also order a prn Roxanol dose in the event he needs extra dosing or in the event the IV is lost etc.   3.  He may develop more symptoms that he currently doesn't have so will add Ativan and robinul to orders just in case these are needed.      BRIEF HISTORY: Pt is 82 yo man who came in unresponsive and hypotensive from [facility].  He has been declining in terms of mental status for the last 23 days.  He was intubated in the ER.  He has advanced Dementia and has a h/o stroke and a scrotal mass.  He was found to be in septic shock due to pneumonia and he has had the other problems as listed below.  Dr Sung Amabile discussed comfort care Family agreed with comfort terminal care HERE but had refused transfer to [facility]ospice Home on 3/19.  Wife is Elease Hashimoto     IMPRESSION: Septic  chock Acute resp failure with hypoxia Acute Right parieto-occiptial CVA  H/O CVA  H/O Hypernatremia Hyperkalemia Acute Renal Failure due to dehydration and poor by mouth intake Elevated Troponin due to demand ischemia HTN Dyslipdemia Elevated PSA Phimosis Overactive bladder Dementia --alzheimers type Vitamin D deficiency   REVIEW OF SYSTEMS:  Patient is not able to provide ROS due to being unresponsive and actively dying.   SPIRITUAL SUPPORT SYSTEM: Yes --family.  SOCIAL HISTORY:  reports that he has never smoked. He has never used smokeless tobacco. He reports that he does not drink alcohol or use illicit drugs.  LEGAL DOCUMENTS:  DNR form  CODE STATUS: DNR  PAST MEDICAL HISTORY: Past Medical History  Diagnosis Date  . Hypertension   . Stroke (HCC)   . Hyperlipemia   . OAB (overactive bladder)   . Scrotal mass   . Elevated PSA   . Phimosis   . BP (high blood pressure) 05/31/2015  . Cerebral vascular accident (HCC) 05/31/2015  . Essential (primary) hypertension 12/23/2013    PAST SURGICAL HISTORY:  Past Surgical History  Procedure Laterality Date  . Colonoscopy w/ polypectomy  1976    ALLERGIES:  is allergic to alprazolam; memantine hcl; and memantine.  MEDICATIONS:  Current Facility-Administered Medications  Medication Dose Route Frequency Provider Last Rate Last Dose  . acetaminophen (TYLENOL) suppository 650 mg  650 mg Rectal Q6H PRN Adrian SaranSital Mody, MD      . morphine 2 MG/ML injection 2-4 mg  2-4 mg Intravenous Q15 min PRN Merwyn Katosavid B Simonds, MD   2 mg at 11/17/2015 0750  . ondansetron (ZOFRAN) injection 4 mg  4 mg Intravenous Q6H PRN Adrian SaranSital Mody, MD        Vital Signs: BP 96/70 mmHg  Pulse 32  Temp(Src) 98.9 F (37.2 C) (Oral)  Resp 42  Ht 6\' 1"  (1.854 m)  Wt 75.8 kg (167 lb 1.7 oz)  BMI 22.05 kg/m2  SpO2 92% Filed Weights   2016-08-29 1056 2016-08-29 1445  Weight: 75 kg (165 lb 5.5 oz) 75.8 kg (167 lb 1.7 oz)    Estimated body mass index is 22.05 kg/(m^2)  as calculated from the following:   Height as of this encounter: 6\' 1"  (1.854 m).   Weight as of this encounter: 75.8 kg (167 lb 1.7 oz).  PERFORMANCE STATUS (ECOG) : 4 - Bedbound  PHYSICAL EXAM: Lying in medical bed --breathing fast --about 35 breaths per minute He is not grimacing or moaning but respirations are rapid He is unresponsive No JVD or TM Hrt rrr --distant Lungs shallow rapid breathing Abd soft nt Ext no mottling or cyanosis   LABS: CBC:    Component Value Date/Time   WBC 16.5* 12/02/2015 0901   WBC 6.6 05/23/2012 0013   HGB 10.7* 12/02/2015 0901   HGB 12.7* 05/23/2012 0013   HCT 33.5* 12/02/2015 0901   HCT 37.8* 05/23/2012 0013   PLT 58* 12/02/2015 0901   PLT 197 05/23/2012 0013   MCV 94.6 12/02/2015 0901   MCV 100 05/23/2012 0013   NEUTROABS 10.7* 13-Aug-2016 1104   LYMPHSABS 3.0 13-Aug-2016 1104   MONOABS 0.7 13-Aug-2016 1104   EOSABS 0.0 13-Aug-2016 1104   BASOSABS 0.0 13-Aug-2016 1104   Comprehensive Metabolic Panel:    Component Value Date/Time   NA 152* 12/02/2015 0901   NA 142 05/23/2012 0013   K 3.4* 12/02/2015 0901   K 3.5 05/23/2012 0013   CL 120* 12/02/2015 0901   CL 108* 05/23/2012 0013   CO2 22 12/02/2015 0901   CO2 26 05/23/2012 0013   BUN 153* 12/02/2015 0901   BUN 16 05/23/2012 0013   CREATININE 6.46* 12/02/2015 0901   CREATININE 1.20 05/23/2012 0013   GLUCOSE 182* 12/02/2015 0901   GLUCOSE 155* 05/23/2012 0013   CALCIUM 8.1* 12/02/2015 0901   CALCIUM 8.3* 05/23/2012 0013   AST 381* 12/02/2015 0901   AST 21 05/23/2012 0013   ALT 479* 12/02/2015 0901   ALT 29 05/23/2012 0013   ALKPHOS 61 12/02/2015 0901   ALKPHOS 72 05/23/2012 0013   BILITOT 0.8 12/02/2015 0901   BILITOT 0.3 05/23/2012 0013   PROT 5.5* 12/02/2015 0901   PROT 6.9 05/23/2012 0013   ALBUMIN 2.4* 12/02/2015 0901   ALBUMIN 3.6 05/23/2012 0013    More than 50% of the visit was spent in counseling/coordination of care: Yes  Time Spent: 55minutes

## 2015-12-06 NOTE — Progress Notes (Signed)
Pt's respirations still at 38/min after earlier increase in morphine drip.  Oncoming RN Annice PihJackie and this RN increased rate to 6mg /hr.  Family at bedside in agreement.  Orson Apeanielle Kemari Mares, RN

## 2015-12-06 NOTE — Progress Notes (Signed)
Palliative Care Update  Pt is seen again at 6:40 pm.  No one but pt is in the room currently.  Nurse reported to me that pts respirations are still about 35 plus.  I have ordered morphine to go up to 5 mg/ hr and even with that, so far, his respirations are quite rapid. We would like to make him free of any air hunger.  Titration of morphine can go up to 10 mg/hr, but hope he will respond to current dose.  Suan HalterMargaret F Goro Wenrick MD

## 2015-12-17 NOTE — Discharge Summary (Signed)
    Death Note please see Last Note for all details.   In breif -82 year old male patient with history of CVA, dementia, hypertension, history of scrotal mass [facility] because of altered mental status, hypoxia. Found to have acute respiratory failure, hypernatremia, sepsis due to pneumonia. Because of multiple medical problems and advanced dementia patient was made comfort care and more to 1 cc. And patient started on  morphine and Ativan, seen by palliative care, started on morphine drip. And patient to expired peacefully on March 21 at 9:32 PM.    Jacob SkeetersFrederick Clark CSN:[id number],MRN:[id number] is a 82 y.o. male, Outpatient Primary MD for the patient is Jacob Clark, JAMES, MD  Pronounced dead by registered nurse Adela LankJacqueline on 11/16/2015     @      21.32             Cause of death ' Acute respiratory failure Pneumonia Acute CVA affecting acute right parieto-occipital infarct Hypernatremia Sepsis Metabolic encephalopathy neck selected renal failure Advanced dementia  Total clinical and documentation time for today Under 30 minutes   Last Note;82 year old male with past medical history significant for CVA, dementia advanced, functional decline recently, hypertension and history of scrotal mass presented to [facility] from [facility] secondary to hypertension hypoxia and altered mental status.  Initially admitted to ICU, intubated, but due to rapid decline and no improvement in his labs and clinical condition- family has chosen comfort care. started on Morphine drip by palliative care team.  He has the following diagnosis now:  # Acute respiratory failure # Pneumonia # Acute CVA-CT of the head showing acute right parieto-occipital infarct. # Hypernatremia # Sepsis # ARF # Metabolic encephalopathy # Advanced dementia # Elevated troponin

## 2015-12-17 NOTE — Progress Notes (Signed)
Pt expired at 2132, noted no respiration, no heart beat, and no pulse, verified by Maretta Beesarmen Herrera, RN. MD notified, [facility], Family at bedside, [facility] notified.

## 2015-12-17 NOTE — Clinical Social Work Note (Signed)
Pt expired overnight. CSW is signing off as no further needs identified.   Larya Charpentier, MSW, LCSW Clinical Social Worker  [phone] 

## 2015-12-17 DEATH — deceased

## 2016-08-20 IMAGING — CT CT HEAD W/O CM
2 series · 16 of 30 positions shown, 20 images · non-contrast
Comparison: 11/23/2014

CLINICAL DATA: Unresponsive

EXAM:
CT HEAD WITHOUT CONTRAST
TECHNIQUE: Contiguous axial images were obtained from the base of the skull
through the vertex without intravenous contrast.

[Series 2: head wo · axial · 0.44mm/px · z∈[+620,+746]mm · 13 of 31 slices shown, 17 images]
[im 3/31  brain]
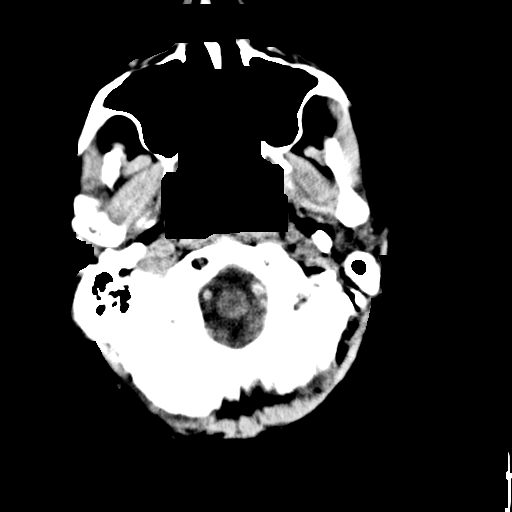
[im 3/31  bone]
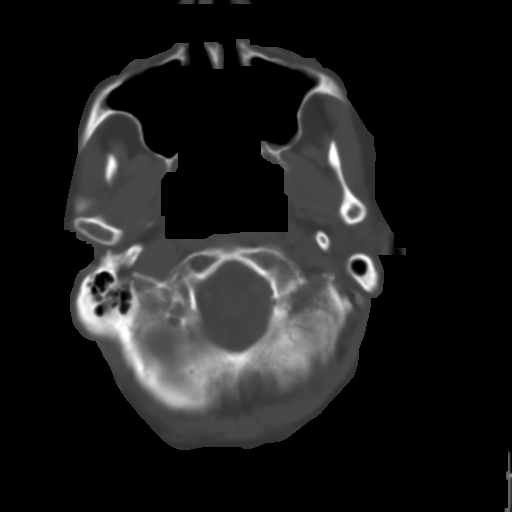
[im 5/31  brain]
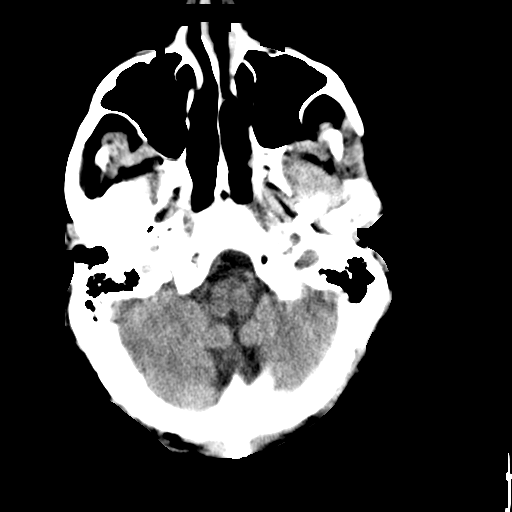
[im 7/31  brain]
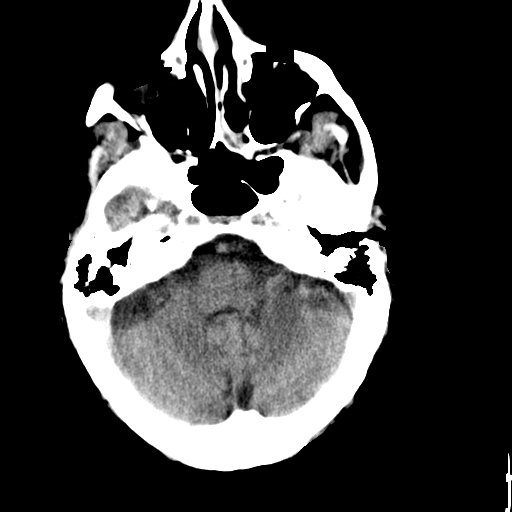
[im 9/31  brain]
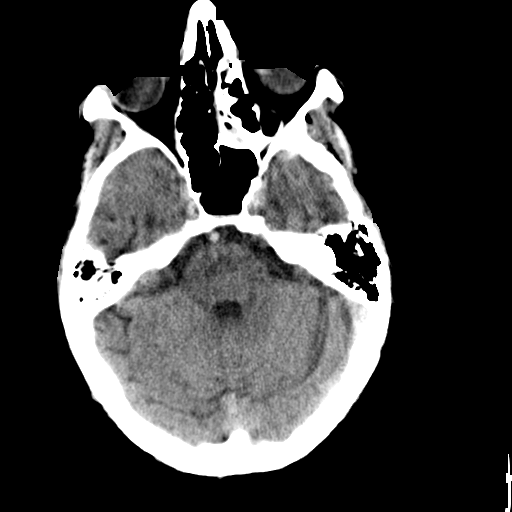
[im 11/31  brain]
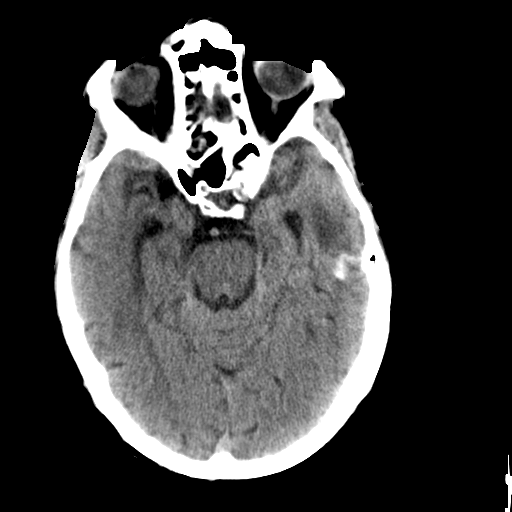
[im 11/31  bone]
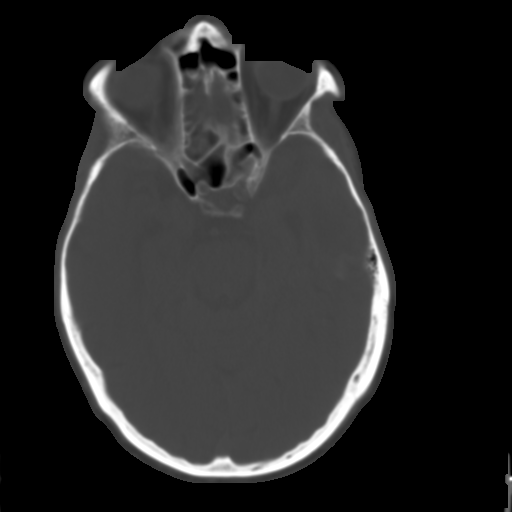
[im 13/31  brain]
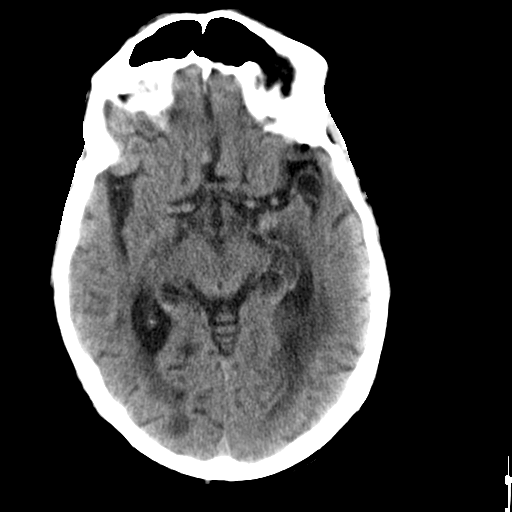
[im 16/31  brain]
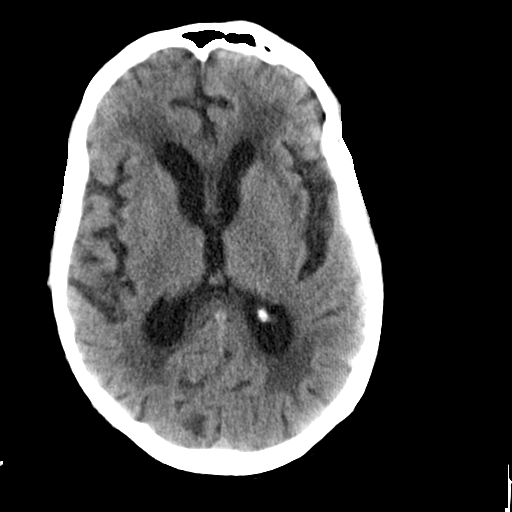
[im 18/31  brain]
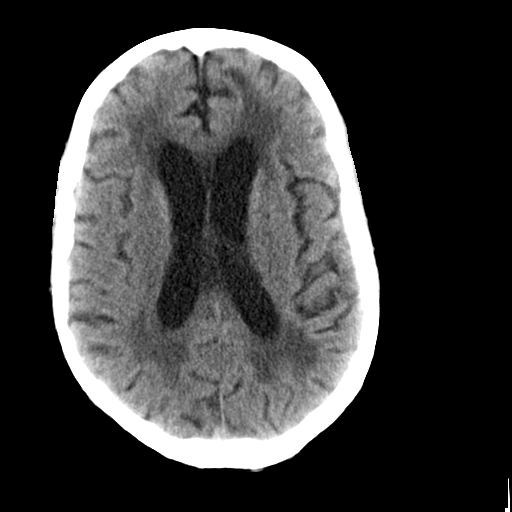
[im 20/31  brain]
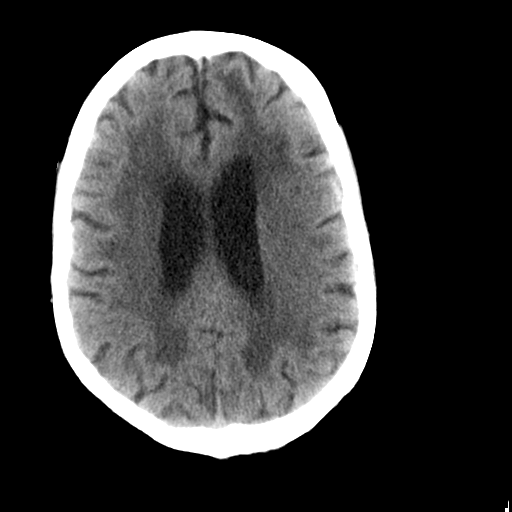
[im 20/31  bone]
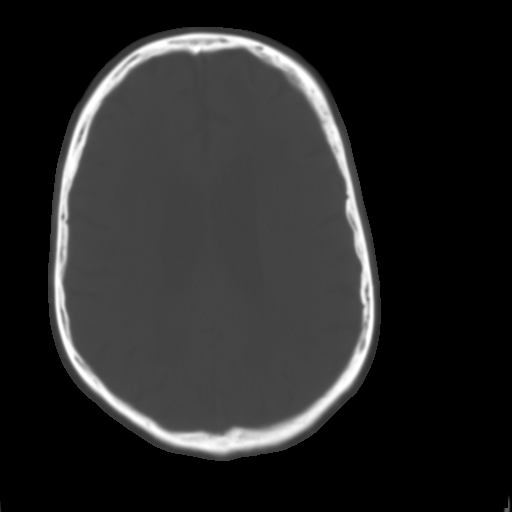
[im 22/31  brain]
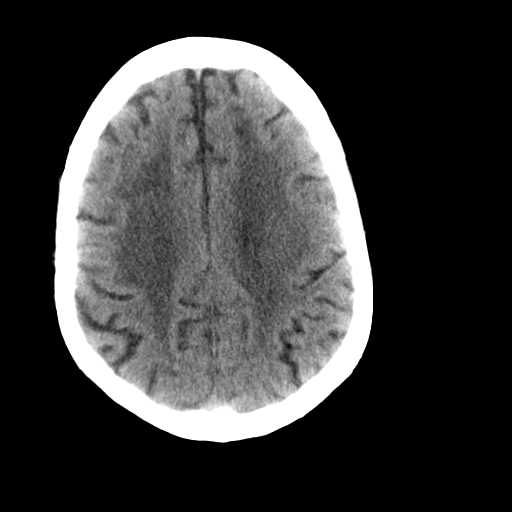
[im 24/31  brain]
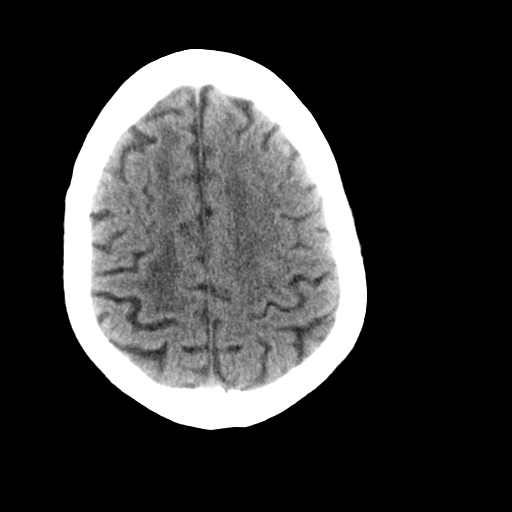
[im 26/31  brain]
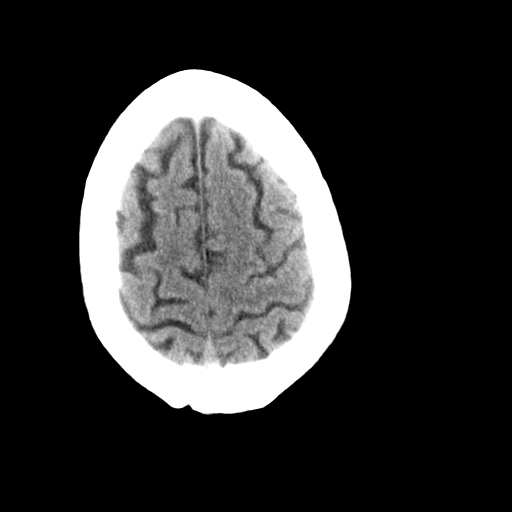
[im 28/31  brain]
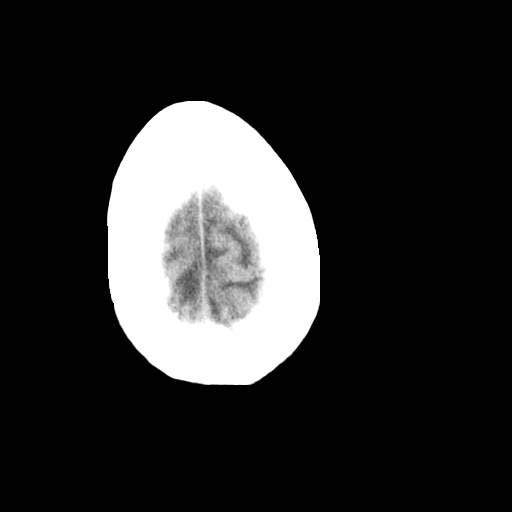
[im 28/31  bone]
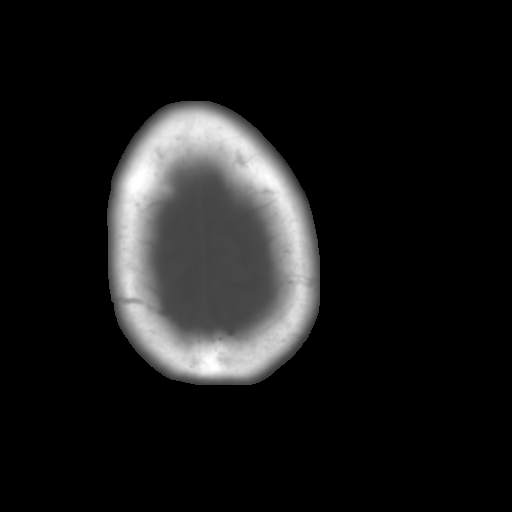

[Series 3: head bone · axial · 0.44mm/px · z∈[+620,+666]mm · 3 of 32 slices shown]
[im 3/32  bone]
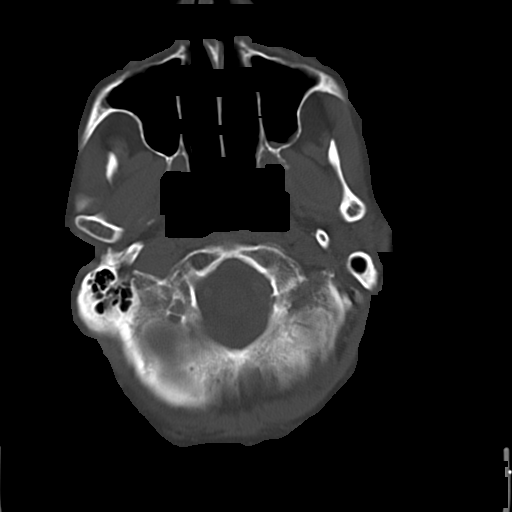
[im 7/32  bone]
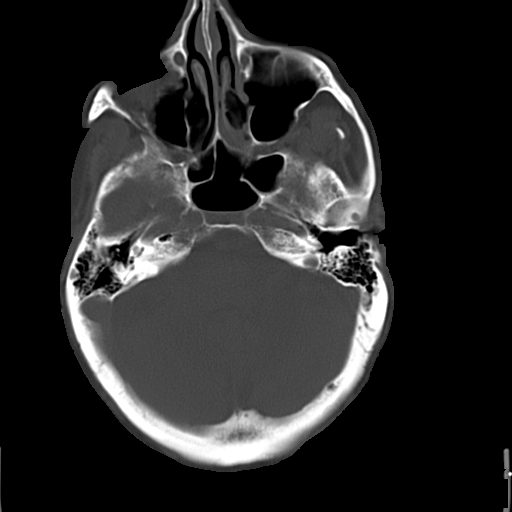
[im 12/32  bone]
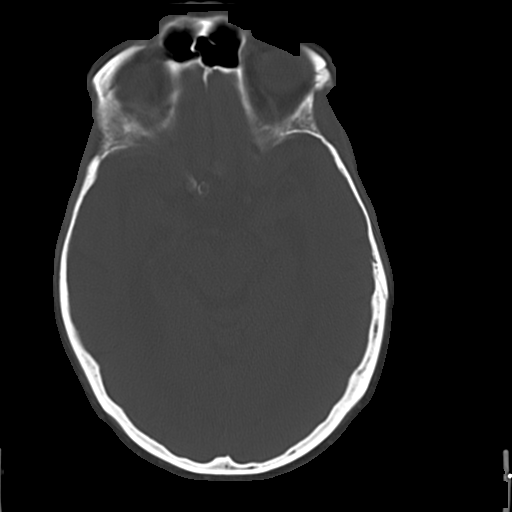

[16 of 30 positions shown; findings below may reference images not displayed]

FINDINGS: Bony calvarium is intact. Atrophic and chronic white matter ischemic
changes are seen. Somewhat rounded area of decreased attenuation is
noted in the right parietal-occipital lobe best seen on image number
14 of series 2. This measures 16 mm and is consistent with a small
focus of likely acute ischemia. No findings to suggest acute
hemorrhage or space-occupying mass lesion are noted.
IMPRESSION: Chronic atrophic and ischemic changes.

Likely small area of acute ischemia in the right parieto-occipital
lobe as described.
